# Patient Record
Sex: Female | Born: 1985 | Race: White | Hispanic: No | Marital: Married | Smoking: Never smoker
Health system: Southern US, Community
[De-identification: ages and names within clinical notes are randomized; demographics above are authoritative.]

## PROBLEM LIST (undated history)

## (undated) ENCOUNTER — Inpatient Hospital Stay (HOSPITAL_COMMUNITY): Payer: Self-pay

## (undated) DIAGNOSIS — Z8619 Personal history of other infectious and parasitic diseases: Secondary | ICD-10-CM

## (undated) DIAGNOSIS — N200 Calculus of kidney: Secondary | ICD-10-CM

## (undated) DIAGNOSIS — R519 Headache, unspecified: Secondary | ICD-10-CM

## (undated) HISTORY — PX: MYRINGOTOMY WITH TUBE PLACEMENT: SHX5663

## (undated) HISTORY — DX: Personal history of other infectious and parasitic diseases: Z86.19

## (undated) HISTORY — DX: Calculus of kidney: N20.0

## (undated) HISTORY — DX: Headache, unspecified: R51.9

## (undated) HISTORY — PX: TONSILLECTOMY: SUR1361

---

## 2005-07-13 ENCOUNTER — Ambulatory Visit: Payer: Self-pay | Admitting: Pediatrics

## 2013-07-30 ENCOUNTER — Other Ambulatory Visit (HOSPITAL_COMMUNITY): Payer: Self-pay | Admitting: Obstetrics and Gynecology

## 2013-07-30 DIAGNOSIS — Z3141 Encounter for fertility testing: Secondary | ICD-10-CM

## 2013-08-02 ENCOUNTER — Ambulatory Visit (HOSPITAL_COMMUNITY)
Admission: RE | Admit: 2013-08-02 | Discharge: 2013-08-02 | Disposition: A | Discharge status: Home / Self Care | Payer: BC Managed Care – PPO | Source: Ambulatory Visit | Attending: Obstetrics and Gynecology | Admitting: Obstetrics and Gynecology

## 2013-08-02 DIAGNOSIS — N979 Female infertility, unspecified: Secondary | ICD-10-CM | POA: Insufficient documentation

## 2013-08-02 DIAGNOSIS — Z3141 Encounter for fertility testing: Secondary | ICD-10-CM

## 2013-08-02 MED ORDER — IOHEXOL 300 MG/ML  SOLN
10.0000 mL | Freq: Once | INTRAMUSCULAR | Status: AC | PRN
  Administered 2013-08-02: 10 mL

## 2014-08-15 LAB — OB RESULTS CONSOLE GC/CHLAMYDIA
Chlamydia: NEGATIVE
GC PROBE AMP, GENITAL: NEGATIVE

## 2014-08-15 LAB — OB RESULTS CONSOLE RUBELLA ANTIBODY, IGM: RUBELLA: IMMUNE

## 2014-08-15 LAB — OB RESULTS CONSOLE HEPATITIS B SURFACE ANTIGEN: HEP B S AG: NEGATIVE

## 2014-08-15 LAB — OB RESULTS CONSOLE ANTIBODY SCREEN: Antibody Screen: NEGATIVE

## 2014-08-15 LAB — OB RESULTS CONSOLE HIV ANTIBODY (ROUTINE TESTING): HIV: NONREACTIVE

## 2014-08-15 LAB — OB RESULTS CONSOLE RPR: RPR: NONREACTIVE

## 2014-08-15 LAB — OB RESULTS CONSOLE ABO/RH: RH Type: NEGATIVE

## 2014-11-28 ENCOUNTER — Inpatient Hospital Stay (HOSPITAL_COMMUNITY)
Admission: AD | Admit: 2014-11-28 | Payer: BC Managed Care – PPO | Source: Ambulatory Visit | Admitting: Obstetrics and Gynecology

## 2015-02-20 LAB — OB RESULTS CONSOLE GBS: STREP GROUP B AG: POSITIVE

## 2015-03-10 ENCOUNTER — Encounter (HOSPITAL_COMMUNITY): Payer: Self-pay | Admitting: *Deleted

## 2015-03-10 ENCOUNTER — Telehealth (HOSPITAL_COMMUNITY): Payer: Self-pay | Admitting: *Deleted

## 2015-03-10 NOTE — Telephone Encounter (Signed)
Preadmission screen  

## 2015-03-18 ENCOUNTER — Inpatient Hospital Stay (HOSPITAL_COMMUNITY): Payer: BC Managed Care – PPO | Admitting: Anesthesiology

## 2015-03-18 ENCOUNTER — Inpatient Hospital Stay (HOSPITAL_COMMUNITY)
Admission: RE | Admit: 2015-03-18 | Discharge: 2015-03-20 | DRG: 775 | Disposition: A | Discharge status: Home / Self Care | Payer: BC Managed Care – PPO | Source: Ambulatory Visit | Attending: Obstetrics and Gynecology | Admitting: Obstetrics and Gynecology

## 2015-03-18 ENCOUNTER — Encounter (HOSPITAL_COMMUNITY): Payer: Self-pay

## 2015-03-18 DIAGNOSIS — R51 Headache: Secondary | ICD-10-CM | POA: Diagnosis present

## 2015-03-18 DIAGNOSIS — Z3A40 40 weeks gestation of pregnancy: Secondary | ICD-10-CM | POA: Diagnosis present

## 2015-03-18 DIAGNOSIS — O99354 Diseases of the nervous system complicating childbirth: Secondary | ICD-10-CM | POA: Diagnosis present

## 2015-03-18 DIAGNOSIS — Z809 Family history of malignant neoplasm, unspecified: Secondary | ICD-10-CM

## 2015-03-18 DIAGNOSIS — O480 Post-term pregnancy: Secondary | ICD-10-CM | POA: Diagnosis present

## 2015-03-18 LAB — CBC
HEMATOCRIT: 38.1 % (ref 36.0–46.0)
Hemoglobin: 13.2 g/dL (ref 12.0–15.0)
MCH: 30.9 pg (ref 26.0–34.0)
MCHC: 34.6 g/dL (ref 30.0–36.0)
MCV: 89.2 fL (ref 78.0–100.0)
Platelets: 153 10*3/uL (ref 150–400)
RBC: 4.27 MIL/uL (ref 3.87–5.11)
RDW: 14 % (ref 11.5–15.5)
WBC: 11.8 10*3/uL — ABNORMAL HIGH (ref 4.0–10.5)

## 2015-03-18 LAB — RPR: RPR: NONREACTIVE

## 2015-03-18 MED ORDER — LIDOCAINE HCL (PF) 1 % IJ SOLN
INTRAMUSCULAR | Status: DC | PRN
  Administered 2015-03-18 (×2): 4 mL

## 2015-03-18 MED ORDER — DIPHENHYDRAMINE HCL 50 MG/ML IJ SOLN: 12.5000 mg | INTRAMUSCULAR | Status: DC | PRN

## 2015-03-18 MED ORDER — OXYTOCIN 40 UNITS IN LACTATED RINGERS INFUSION - SIMPLE MED
1.0000 m[IU]/min | INTRAVENOUS | Status: DC
  Administered 2015-03-18: 1 m[IU]/min via INTRAVENOUS
  Filled 2015-03-18: qty 1000

## 2015-03-18 MED ORDER — OXYCODONE-ACETAMINOPHEN 5-325 MG PO TABS: 1.0000 | ORAL_TABLET | ORAL | Status: DC | PRN

## 2015-03-18 MED ORDER — ACETAMINOPHEN 325 MG PO TABS: 650.0000 mg | ORAL_TABLET | ORAL | Status: DC | PRN

## 2015-03-18 MED ORDER — TETANUS-DIPHTH-ACELL PERTUSSIS 5-2.5-18.5 LF-MCG/0.5 IM SUSP: 0.5000 mL | Freq: Once | INTRAMUSCULAR | Status: DC

## 2015-03-18 MED ORDER — ONDANSETRON HCL 4 MG/2ML IJ SOLN: 4.0000 mg | Freq: Four times a day (QID) | INTRAMUSCULAR | Status: DC | PRN

## 2015-03-18 MED ORDER — DIPHENHYDRAMINE HCL 25 MG PO CAPS: 25.0000 mg | ORAL_CAPSULE | Freq: Four times a day (QID) | ORAL | Status: DC | PRN

## 2015-03-18 MED ORDER — OXYCODONE-ACETAMINOPHEN 5-325 MG PO TABS: 2.0000 | ORAL_TABLET | ORAL | Status: DC | PRN

## 2015-03-18 MED ORDER — PRENATAL MULTIVITAMIN CH
1.0000 | ORAL_TABLET | Freq: Every day | ORAL | Status: DC
  Administered 2015-03-19 – 2015-03-20 (×2): 1 via ORAL
  Filled 2015-03-18 (×2): qty 1

## 2015-03-18 MED ORDER — LACTATED RINGERS IV SOLN
500.0000 mL | INTRAVENOUS | Status: DC | PRN
  Administered 2015-03-18: 500 mL via INTRAVENOUS

## 2015-03-18 MED ORDER — PHENYLEPHRINE 40 MCG/ML (10ML) SYRINGE FOR IV PUSH (FOR BLOOD PRESSURE SUPPORT)
80.0000 ug | PREFILLED_SYRINGE | INTRAVENOUS | Status: DC | PRN
Start: 2015-03-18 — End: 2015-03-18
  Filled 2015-03-18: qty 2
  Filled 2015-03-18: qty 20

## 2015-03-18 MED ORDER — OXYTOCIN 40 UNITS IN LACTATED RINGERS INFUSION - SIMPLE MED: 1.0000 m[IU]/min | INTRAVENOUS | Status: DC

## 2015-03-18 MED ORDER — BENZOCAINE-MENTHOL 20-0.5 % EX AERO
1.0000 "application " | INHALATION_SPRAY | CUTANEOUS | Status: DC | PRN
  Administered 2015-03-20: 1 via TOPICAL
  Filled 2015-03-18: qty 56

## 2015-03-18 MED ORDER — EPHEDRINE 5 MG/ML INJ: 10.0000 mg | INTRAVENOUS | Status: DC | PRN

## 2015-03-18 MED ORDER — MEASLES, MUMPS & RUBELLA VAC ~~LOC~~ INJ: 0.5000 mL | INJECTION | Freq: Once | SUBCUTANEOUS | Status: DC

## 2015-03-18 MED ORDER — SENNOSIDES-DOCUSATE SODIUM 8.6-50 MG PO TABS
2.0000 | ORAL_TABLET | ORAL | Status: DC
  Administered 2015-03-19 – 2015-03-20 (×2): 2 via ORAL
  Filled 2015-03-18 (×2): qty 2

## 2015-03-18 MED ORDER — OXYTOCIN 40 UNITS IN LACTATED RINGERS INFUSION - SIMPLE MED: 62.5000 mL/h | INTRAVENOUS | Status: DC

## 2015-03-18 MED ORDER — ZOLPIDEM TARTRATE 5 MG PO TABS
5.0000 mg | ORAL_TABLET | Freq: Every evening | ORAL | Status: DC | PRN
  Administered 2015-03-18: 5 mg via ORAL
  Filled 2015-03-18: qty 1

## 2015-03-18 MED ORDER — DIBUCAINE 1 % RE OINT: 1.0000 "application " | TOPICAL_OINTMENT | RECTAL | Status: DC | PRN

## 2015-03-18 MED ORDER — LACTATED RINGERS IV SOLN
INTRAVENOUS | Status: DC
  Administered 2015-03-18 (×3): via INTRAVENOUS

## 2015-03-18 MED ORDER — PENICILLIN G POTASSIUM 5000000 UNITS IJ SOLR
2.5000 10*6.[IU] | INTRAVENOUS | Status: DC
  Administered 2015-03-18 (×2): 2.5 10*6.[IU] via INTRAVENOUS
  Filled 2015-03-18 (×7): qty 2.5

## 2015-03-18 MED ORDER — FENTANYL 2.5 MCG/ML BUPIVACAINE 1/10 % EPIDURAL INFUSION (WH - ANES)
14.0000 mL/h | INTRAMUSCULAR | Status: DC | PRN
  Administered 2015-03-18 (×3): 14 mL/h via EPIDURAL
  Filled 2015-03-18 (×2): qty 125

## 2015-03-18 MED ORDER — MEDROXYPROGESTERONE ACETATE 150 MG/ML IM SUSP: 150.0000 mg | INTRAMUSCULAR | Status: DC | PRN

## 2015-03-18 MED ORDER — LANOLIN HYDROUS EX OINT: TOPICAL_OINTMENT | CUTANEOUS | Status: DC | PRN

## 2015-03-18 MED ORDER — SIMETHICONE 80 MG PO CHEW: 80.0000 mg | CHEWABLE_TABLET | ORAL | Status: DC | PRN

## 2015-03-18 MED ORDER — OXYTOCIN BOLUS FROM INFUSION: 500.0000 mL | INTRAVENOUS | Status: DC

## 2015-03-18 MED ORDER — CITRIC ACID-SODIUM CITRATE 334-500 MG/5ML PO SOLN: 30.0000 mL | ORAL | Status: DC | PRN

## 2015-03-18 MED ORDER — ONDANSETRON HCL 4 MG/2ML IJ SOLN: 4.0000 mg | INTRAMUSCULAR | Status: DC | PRN

## 2015-03-18 MED ORDER — WITCH HAZEL-GLYCERIN EX PADS: 1.0000 "application " | MEDICATED_PAD | CUTANEOUS | Status: DC | PRN

## 2015-03-18 MED ORDER — LIDOCAINE HCL (PF) 1 % IJ SOLN: 30.0000 mL | INTRAMUSCULAR | Status: DC | PRN

## 2015-03-18 MED ORDER — DEXTROSE 5 % IV SOLN
5.0000 10*6.[IU] | Freq: Once | INTRAVENOUS | Status: AC
  Administered 2015-03-18: 5 10*6.[IU] via INTRAVENOUS
  Filled 2015-03-18: qty 5

## 2015-03-18 MED ORDER — TERBUTALINE SULFATE 1 MG/ML IJ SOLN: 0.2500 mg | Freq: Once | INTRAMUSCULAR | Status: DC | PRN

## 2015-03-18 MED ORDER — ONDANSETRON HCL 4 MG PO TABS: 4.0000 mg | ORAL_TABLET | ORAL | Status: DC | PRN

## 2015-03-18 MED ORDER — IBUPROFEN 600 MG PO TABS
600.0000 mg | ORAL_TABLET | Freq: Four times a day (QID) | ORAL | Status: DC
  Administered 2015-03-19 – 2015-03-20 (×7): 600 mg via ORAL
  Filled 2015-03-18 (×7): qty 1

## 2015-03-18 NOTE — Progress Notes (Signed)
SVD of vigorous female infant w/ apgars of 8,9.  Placenta delivered spontaneous w/ 3VC.   2nd degree lac repaired w/ 3-0 vicryl rapide.  Fundus firm.  EBL 400cc .

## 2015-03-18 NOTE — Anesthesia Preprocedure Evaluation (Signed)
Anesthesia Evaluation  Patient identified by MRN, date of birth, ID band Patient awake    Reviewed: Allergy & Precautions, NPO status , Patient's Chart, lab work & pertinent test results  History of Anesthesia Complications Negative for: history of anesthetic complications  Airway Mallampati: II  TM Distance: >3 FB Neck ROM: Full    Dental no notable dental hx. (+) Dental Advisory Given   Pulmonary neg pulmonary ROS,  breath sounds clear to auscultation  Pulmonary exam normal       Cardiovascular negative cardio ROS Normal cardiovascular examRhythm:Regular Rate:Normal     Neuro/Psych  Headaches, negative psych ROS   GI/Hepatic negative GI ROS, Neg liver ROS,   Endo/Other  negative endocrine ROS  Renal/GU negative Renal ROS  negative genitourinary   Musculoskeletal negative musculoskeletal ROS (+)   Abdominal   Peds negative pediatric ROS (+)  Hematology negative hematology ROS (+)   Anesthesia Other Findings   Reproductive/Obstetrics (+) Pregnancy                             Anesthesia Physical Anesthesia Plan  ASA: II  Anesthesia Plan: Epidural   Post-op Pain Management:    Induction:   Airway Management Planned:   Additional Equipment:   Intra-op Plan:   Post-operative Plan:   Informed Consent: I have reviewed the patients History and Physical, chart, labs and discussed the procedure including the risks, benefits and alternatives for the proposed anesthesia with the patient or authorized representative who has indicated his/her understanding and acceptance.   Dental advisory given  Plan Discussed with: CRNA  Anesthesia Plan Comments:         Anesthesia Quick Evaluation

## 2015-03-18 NOTE — Progress Notes (Signed)
Patient tolerated epidural placement well.

## 2015-03-18 NOTE — Plan of Care (Signed)
Problem: Consults Goal: Birthing Suites Patient Information Press F2 to bring up selections list  Outcome: Completed/Met Date Met:  03/18/15  Pt > [redacted] weeks EGA and Inpatient induction

## 2015-03-18 NOTE — Anesthesia Procedure Notes (Signed)
Epidural Patient location during procedure: OB  Staffing Anesthesiologist: Anasophia Pecor Performed by: anesthesiologist   Preanesthetic Checklist Completed: patient identified, site marked, surgical consent, pre-op evaluation, timeout performed, IV checked, risks and benefits discussed and monitors and equipment checked  Epidural Patient position: sitting Prep: site prepped and draped and DuraPrep Patient monitoring: continuous pulse ox and blood pressure Approach: midline Location: L3-L4 Injection technique: LOR saline  Needle:  Needle type: Tuohy  Needle gauge: 17 G Needle length: 9 cm and 9 Needle insertion depth: 5 cm cm Catheter type: closed end flexible Catheter size: 19 Gauge Catheter at skin depth: 10 cm Test dose: negative  Assessment Events: blood not aspirated, injection not painful, no injection resistance, negative IV test and no paresthesia  Additional Notes Patient identified. Risks/Benefits/Options discussed with patient including but not limited to bleeding, infection, nerve damage, paralysis, failed block, incomplete pain control, headache, blood pressure changes, nausea, vomiting, reactions to medication both or allergic, itching and postpartum back pain. Confirmed with bedside nurse the patient's most recent platelet count. Confirmed with patient that they are not currently taking any anticoagulation, have any bleeding history or any family history of bleeding disorders. Patient expressed understanding and wished to proceed. All questions were answered. Sterile technique was used throughout the entire procedure. Please see nursing notes for vital signs. Test dose was given through epidural catheter and negative prior to continuing to dose epidural or start infusion. Warning signs of high block given to the patient including shortness of breath, tingling/numbness in hands, complete motor block, or any concerning symptoms with instructions to call for help. Patient was  given instructions on fall risk and not to get out of bed. All questions and concerns addressed with instructions to call with any issues or inadequate analgesia.      

## 2015-03-18 NOTE — H&P (Signed)
Rachael Elliott is a 29 y.o. female G1 @ 40+ wks presenting for postdates IOL.  History OB History    Gravida Para Term Preterm AB TAB SAB Ectopic Multiple Living   1              Past Medical History  Diagnosis Date  . Hx of varicella   . Headache    Past Surgical History  Procedure Laterality Date  . Tonsillectomy     Family History: family history includes Cancer in her mother. Social History:  reports that she has never smoked. She has never used smokeless tobacco. She reports that she does not drink alcohol or use illicit drugs.   Prenatal Transfer Tool  Maternal Diabetes: No Genetic Screening: declines Maternal Ultrasounds/Referrals: Normal Fetal Ultrasounds or other Referrals:  None Maternal Substance Abuse:  No Significant Maternal Medications:  None Significant Maternal Lab Results:  None Other Comments:  None  ROS  Dilation: 2 Effacement (%): 80 Station: -2 Exam by:: Dr Renaldo Fiddler Blood pressure 129/89, pulse 74, temperature 98.2 F (36.8 C), temperature source Oral, resp. rate 16, height  (1.727 m), weight 185 lb (83.915 kg). Exam Physical Exam  Gen - NAD Abd - gravid, NT  EFW 8# Ext - NT, bilat edema Cvx 2/80/-2 AROM - clear Prenatal labs: ABO, Rh: --/--/A NEG (08/23 0100) Antibody: POS (08/23 0100) Rubella: Immune (01/21 0000) RPR: Nonreactive (01/21 0000)  HBsAg: Negative (01/21 0000)  HIV: Non-reactive (01/21 0000)  GBS: Positive (07/28 0000)   Assessment/Plan: Admit Pitocin IOL PCN for GBS Epidural prn   Rachael Elliott 03/18/2015, 7:57 AM

## 2015-03-19 LAB — CBC
HCT: 35.5 % — ABNORMAL LOW (ref 36.0–46.0)
Hemoglobin: 12.1 g/dL (ref 12.0–15.0)
MCH: 30.6 pg (ref 26.0–34.0)
MCHC: 34.1 g/dL (ref 30.0–36.0)
MCV: 89.6 fL (ref 78.0–100.0)
PLATELETS: 130 10*3/uL — AB (ref 150–400)
RBC: 3.96 MIL/uL (ref 3.87–5.11)
RDW: 13.9 % (ref 11.5–15.5)
WBC: 16.2 10*3/uL — ABNORMAL HIGH (ref 4.0–10.5)

## 2015-03-19 NOTE — Anesthesia Postprocedure Evaluation (Signed)
  Anesthesia Post-op Note  Patient: Rachael Elliott  Procedure(s) Performed: * No procedures listed *  Patient Location: Mother/Baby  Anesthesia Type:Epidural  Level of Consciousness: awake, alert , oriented and patient cooperative  Airway and Oxygen Therapy: Patient Spontanous Breathing  Post-op Pain: mild  Post-op Assessment: Patient's Cardiovascular Status Stable, Respiratory Function Stable, No headache, No backache and Patient able to bend at knees              Post-op Vital Signs: Reviewed and stable  Last Vitals:  Filed Vitals:   03/19/15 0524  BP: 113/68  Pulse: 75  Temp: 36.4 C  Resp: 18    Complications: No apparent anesthesia complications

## 2015-03-19 NOTE — Progress Notes (Signed)
Post Partum Day 1 Subjective: no complaints, up ad lib, voiding and tolerating PO  Objective: Blood pressure 113/68, pulse 75, temperature 97.6 F (36.4 C), temperature source Oral, resp. rate 18, height  (1.727 m), weight 185 lb (83.915 kg), SpO2 89 %, unknown if currently breastfeeding.  Physical Exam:  General: alert and cooperative Lochia: appropriate Uterine Fundus: firm Incision: healing well, small labial edema noted DVT Evaluation: No evidence of DVT seen on physical exam. Negative Homan's sign. No cords or calf tenderness. No significant calf/ankle edema.   Recent Labs  03/18/15 0100 03/19/15 0520  HGB 13.2 12.1  HCT 38.1 35.5*    Assessment/Plan: Plan for discharge tomorrow   LOS: 1 day   Rachael Elliott 03/19/2015, 7:58 AM

## 2015-03-20 ENCOUNTER — Ambulatory Visit: Payer: Self-pay

## 2015-03-20 MED ORDER — OXYCODONE-ACETAMINOPHEN 5-325 MG PO TABS: 1.0000 | ORAL_TABLET | ORAL | Status: DC | PRN

## 2015-03-20 MED ORDER — IBUPROFEN 600 MG PO TABS: 600.0000 mg | ORAL_TABLET | Freq: Four times a day (QID) | ORAL | Status: DC

## 2015-03-20 NOTE — Discharge Summary (Signed)
Obstetric Discharge Summary Reason for Admission: induction of labor Prenatal Procedures: ultrasound Intrapartum Procedures: spontaneous vaginal delivery Postpartum Procedures: none Complications-Operative and Postpartum: 2 degree perineal laceration HEMOGLOBIN  Date Value Ref Range Status  03/19/2015 12.1 12.0 - 15.0 g/dL Final   HCT  Date Value Ref Range Status  03/19/2015 35.5* 36.0 - 46.0 % Final    Physical Exam:  General: alert and cooperative, denies hA Lochia: appropriate Uterine Fundus: firm Incision: healing well DVT Evaluation: No evidence of DVT seen on physical exam. Negative Homan's sign. No cords or calf tenderness. No significant calf/ankle edema. BP RECHECKED 110/77 Discharge Diagnoses: Term Pregnancy-delivered  Discharge Information: Date: 03/20/2015 Activity: pelvic rest Diet: routine Medications: PNV, Ibuprofen and Percocet Condition: stable Instructions: refer to practice specific booklet Discharge to: home   Newborn Data: Live born female  Birth Weight: 8 lb 6.4 oz (3810 g) APGAR: 8, 9  Home with mother.  CURTIS,CAROL G 03/20/2015, 8:33 AM

## 2015-03-20 NOTE — Lactation Note (Addendum)
This note was copied from the chart of Rachael Ariannie Penaloza. Lactation Consultation Note: Father of infant came in the hallway and ask if I could come back to see infant feeding. Parents informed me that infant had a choking episode and infant spit a lot of yellow spit up. Mother states that infant turned blue when he was choking. Parents afraid that infant would choke again. I advised parents in using bulb syringe and rolling infant to side when choking. I informed infants staff nurse Morrie Sheldon. Parents given reassurance and support.  I observed infant at the breast with audible swallows. Infant only needed  slight adjustment in  positioning. Lots more teaching with parents. Episode of choking caused them to have lots of anxiety. They plan to stay for another feeding.   Patient Name: Rachael Elliott [id number] Date: 03/20/2015 Reason for consult: Follow-up assessment   Maternal Data    Feeding Feeding Type: Breast Fed Length of feed: 15 min  LATCH Score/Interventions Latch: Grasps breast easily, tongue down, lips flanged, rhythmical sucking. Intervention(s): Adjust position  Audible Swallowing: Spontaneous and intermittent  Type of Nipple: Everted at rest and after stimulation  Comfort (Breast/Nipple): Filling, red/small blisters or bruises, mild/mod discomfort     Hold (Positioning): Assistance needed to correctly position infant at breast and maintain latch. Intervention(s): Support Pillows;Position options  LATCH Score: 8  Lactation Tools Discussed/Used     Consult Status Consult Status: Complete    Michel Bickers 03/20/2015, 2:27 PM

## 2015-03-20 NOTE — Lactation Note (Signed)
This note was copied from the chart of Rachael Ariyona Eid. Lactation Consultation Note; Follow up for [name] assistance. . Parents are ready to go home. Inquired if any questions. Parents had lots of questions. Reviewed entire baby and me book, proper latch, wet and dirty chart, milk storage guidelines and treatment for engorgement. Mother advised to follow up with LC or BFSG's. Parents receptive to all teaching.   Patient Name: Rachael Elliott [id number] Date: 03/20/2015 Reason for consult: Follow-up assessment   Maternal Data    Feeding Feeding Type: Breast Fed Length of feed: 15 min  LATCH Score/Interventions Latch: Grasps breast easily, tongue down, lips flanged, rhythmical sucking. Intervention(s): Adjust position  Audible Swallowing: Spontaneous and intermittent  Type of Nipple: Everted at rest and after stimulation  Comfort (Breast/Nipple): Filling, red/small blisters or bruises, mild/mod discomfort     Hold (Positioning): Assistance needed to correctly position infant at breast and maintain latch. Intervention(s): Support Pillows;Position options  LATCH Score: 8  Lactation Tools Discussed/Used     Consult Status Consult Status: Complete    Michel Bickers 03/20/2015, 2:20 PM

## 2015-03-21 LAB — TYPE AND SCREEN
ABO/RH(D): A NEG
ANTIBODY SCREEN: POSITIVE
DAT, IgG: NEGATIVE
UNIT DIVISION: 0
Unit division: 0

## 2017-06-14 LAB — OB RESULTS CONSOLE GC/CHLAMYDIA
Chlamydia: NEGATIVE
Gonorrhea: NEGATIVE

## 2017-06-14 LAB — OB RESULTS CONSOLE RPR: RPR: NONREACTIVE

## 2017-06-14 LAB — OB RESULTS CONSOLE RUBELLA ANTIBODY, IGM: RUBELLA: IMMUNE

## 2017-06-14 LAB — OB RESULTS CONSOLE ANTIBODY SCREEN: Antibody Screen: NEGATIVE

## 2017-06-14 LAB — OB RESULTS CONSOLE ABO/RH: RH TYPE: NEGATIVE

## 2017-06-14 LAB — OB RESULTS CONSOLE HEPATITIS B SURFACE ANTIGEN: HEP B S AG: NEGATIVE

## 2017-06-14 LAB — OB RESULTS CONSOLE HIV ANTIBODY (ROUTINE TESTING): HIV: NONREACTIVE

## 2017-11-16 ENCOUNTER — Other Ambulatory Visit: Payer: Self-pay

## 2017-11-16 ENCOUNTER — Encounter (HOSPITAL_COMMUNITY): Payer: Self-pay

## 2017-11-16 ENCOUNTER — Inpatient Hospital Stay (HOSPITAL_COMMUNITY): Payer: BC Managed Care – PPO

## 2017-11-16 ENCOUNTER — Observation Stay (HOSPITAL_COMMUNITY)
Admission: AD | Admit: 2017-11-16 | Discharge: 2017-11-17 | Disposition: A | Discharge status: Home / Self Care | Payer: BC Managed Care – PPO | Source: Ambulatory Visit | Attending: Obstetrics and Gynecology | Admitting: Obstetrics and Gynecology

## 2017-11-16 DIAGNOSIS — O26833 Pregnancy related renal disease, third trimester: Secondary | ICD-10-CM | POA: Diagnosis not present

## 2017-11-16 DIAGNOSIS — R1030 Lower abdominal pain, unspecified: Secondary | ICD-10-CM | POA: Diagnosis not present

## 2017-11-16 DIAGNOSIS — O9989 Other specified diseases and conditions complicating pregnancy, childbirth and the puerperium: Secondary | ICD-10-CM

## 2017-11-16 DIAGNOSIS — Z79899 Other long term (current) drug therapy: Secondary | ICD-10-CM | POA: Insufficient documentation

## 2017-11-16 DIAGNOSIS — N200 Calculus of kidney: Secondary | ICD-10-CM | POA: Insufficient documentation

## 2017-11-16 DIAGNOSIS — M545 Low back pain: Secondary | ICD-10-CM | POA: Diagnosis present

## 2017-11-16 DIAGNOSIS — M549 Dorsalgia, unspecified: Secondary | ICD-10-CM | POA: Diagnosis not present

## 2017-11-16 DIAGNOSIS — Z3A30 30 weeks gestation of pregnancy: Secondary | ICD-10-CM | POA: Diagnosis not present

## 2017-11-16 DIAGNOSIS — O26839 Pregnancy related renal disease, unspecified trimester: Secondary | ICD-10-CM

## 2017-11-16 DIAGNOSIS — O99891 Other specified diseases and conditions complicating pregnancy: Secondary | ICD-10-CM

## 2017-11-16 DIAGNOSIS — R110 Nausea: Secondary | ICD-10-CM | POA: Diagnosis not present

## 2017-11-16 LAB — URINALYSIS, ROUTINE W REFLEX MICROSCOPIC
Bilirubin Urine: NEGATIVE
Glucose, UA: NEGATIVE mg/dL
HGB URINE DIPSTICK: NEGATIVE
Ketones, ur: NEGATIVE mg/dL
NITRITE: NEGATIVE
PROTEIN: NEGATIVE mg/dL
Specific Gravity, Urine: 1.011 (ref 1.005–1.030)
pH: 7 (ref 5.0–8.0)

## 2017-11-16 MED ORDER — LACTATED RINGERS IV SOLN
INTRAVENOUS | Status: DC
  Administered 2017-11-16 – 2017-11-17 (×2): via INTRAVENOUS

## 2017-11-16 MED ORDER — ZOLPIDEM TARTRATE 5 MG PO TABS: 5.0000 mg | ORAL_TABLET | Freq: Every evening | ORAL | Status: DC | PRN

## 2017-11-16 MED ORDER — PRENATAL MULTIVITAMIN CH: 1.0000 | ORAL_TABLET | Freq: Every day | ORAL | Status: DC

## 2017-11-16 MED ORDER — DOCUSATE SODIUM 100 MG PO CAPS: 100.0000 mg | ORAL_CAPSULE | Freq: Every day | ORAL | Status: DC

## 2017-11-16 MED ORDER — CYCLOBENZAPRINE HCL 10 MG PO TABS
10.0000 mg | ORAL_TABLET | Freq: Once | ORAL | Status: AC
  Administered 2017-11-16: 10 mg via ORAL
  Filled 2017-11-16: qty 1

## 2017-11-16 MED ORDER — ACETAMINOPHEN 325 MG PO TABS: 650.0000 mg | ORAL_TABLET | ORAL | Status: DC | PRN

## 2017-11-16 MED ORDER — ONDANSETRON 8 MG PO TBDP
8.0000 mg | ORAL_TABLET | Freq: Once | ORAL | Status: AC
  Administered 2017-11-16: 8 mg via ORAL
  Filled 2017-11-16: qty 1

## 2017-11-16 MED ORDER — CALCIUM CARBONATE ANTACID 500 MG PO CHEW: 2.0000 | CHEWABLE_TABLET | ORAL | Status: DC | PRN

## 2017-11-16 NOTE — MAU Provider Note (Signed)
History     CSN: [id number]  Arrival date and time: 11/16/17 1751   First Provider Initiated Contact with Patient 11/16/17 1957      Chief Complaint  Patient presents with  . Back Pain  . Nausea   HPI  Ms.  Rachael Elliott is a 32 y.o. year old [id number] female at 4157w6d weeks gestation who presents to MAU reporting sudden onset of low back pain that radiates from lower abdomen today, nausea & hot flashes from the pain. She reports good (+) FM.  Past Medical History:  Diagnosis Date  . Hx of varicella     Past Surgical History:  Procedure Laterality Date  . TONSILLECTOMY      Family History  Problem Relation Age of Onset  . Cancer Mother        breast  . Cancer Maternal Grandmother     Social History   Tobacco Use  . Smoking status: Never Smoker  . Smokeless tobacco: Never Used  Substance Use Topics  . Alcohol use: No  . Drug use: No    Allergies:  Allergies  Allergen Reactions  . Sulfa Antibiotics Rash    Medications Prior to Admission  Medication Sig Dispense Refill Last Dose  . calcium carbonate (TUMS - DOSED IN MG ELEMENTAL CALCIUM) 500 MG chewable tablet Chew 1 tablet by mouth 2 (two) times daily as needed for indigestion or heartburn.   prn  . Prenatal Vit-Fe Fumarate-FA (PRENATAL MULTIVITAMIN) TABS tablet Take 1 tablet by mouth daily at 12 noon.   11/15/2017 at Unknown time    Review of Systems  Constitutional: Negative.   HENT: Negative.   Eyes: Negative.   Respiratory: Negative.   Cardiovascular: Negative.   Gastrointestinal: Positive for abdominal pain (lower).  Endocrine: Negative.   Genitourinary: Negative.   Musculoskeletal: Positive for back pain (lower).  Skin: Negative.   Allergic/Immunologic: Negative.   Neurological: Negative.   Hematological: Negative.   Psychiatric/Behavioral: Negative.    Physical Exam   Blood pressure 112/74, pulse 81, temperature (!) 97.4 F (36.3 C), temperature source Oral, resp. rate 18, height 5\' 8"   (1.727 m), weight 166 lb 9.6 oz (75.6 kg).  Physical Exam  Nursing note and vitals reviewed. Constitutional: She appears well-developed and well-nourished.  HENT:  Head: Normocephalic and atraumatic.  Eyes: Pupils are equal, round, and reactive to light.  Neck: Normal range of motion.  Cardiovascular: Normal rate, regular rhythm and normal heart sounds.  Respiratory: Effort normal and breath sounds normal.  GI: Soft. Bowel sounds are normal. There is no CVA tenderness.  Genitourinary:  Genitourinary Comments:    Musculoskeletal: Normal range of motion.  Neurological: She is alert.  Skin: Skin is warm and dry.  Psychiatric: She has a normal mood and affect. Her behavior is normal. Judgment and thought content normal.    MAU Course  Procedures  MDM CCUA NST - FHR: 130 bpm / moderate variability / accels present / decels absent / TOCO: none Renal U/S Flexeril 10 mg -- minimal relief, "but still can't get comfortable"  *Consult with Dr. Henderson Cloudomblin @ 2005 - notified of patient's complaints, assessments, lab & U/S results, recommended tx plan renal U/S, Flexeril 10 mg *TC to Dr .Henderson Cloudomblin @ 2205 - ok to d/c home with pain meds and strainer, if pain well-controlled now or admit to HROB, if pain not controlled  Results for orders placed or performed during the hospital encounter of 11/16/17 (from the past 24 hour(s))  Urinalysis, Routine w reflex microscopic  Status: Abnormal   Collection Time: 11/16/17  5:51 PM  Result Value Ref Range   Color, Urine YELLOW YELLOW   APPearance CLEAR CLEAR   Specific Gravity, Urine 1.011 1.005 - 1.030   pH 7.0 5.0 - 8.0   Glucose, UA NEGATIVE NEGATIVE mg/dL   Hgb urine dipstick NEGATIVE NEGATIVE   Bilirubin Urine NEGATIVE NEGATIVE   Ketones, ur NEGATIVE NEGATIVE mg/dL   Protein, ur NEGATIVE NEGATIVE mg/dL   Nitrite NEGATIVE NEGATIVE   Leukocytes, UA SMALL (A) NEGATIVE   RBC / HPF 0-5 0 - 5 RBC/hpf   WBC, UA 6-10 0 - 5 WBC/hpf   Bacteria, UA RARE  (A) NONE SEEN   Squamous Epithelial / LPF 6-10 0 - 5   Mucus PRESENT     US Renal  Result Date: 11/16/2017 CLINICAL DATA:  Back pain, 30.[redacted] weeks pregnant EXAM: RENAL / URINARY TRACT ULTRASOUND COMPLETE COMPARISON:  None. FINDINGS: Right Kidney: Length: 10.8 cm. Echogenicity within normal limits. No mass or hydronephrosis visualized. Left Kidney: Length: 13.7 cm. Echogenicity within normal limits. No mass. 9 mm shadowing stone in the mid pole. Mild hydronephrosis in the upper pole; questionable duplex configuration. Bladder: Appears empty. IMPRESSION: 1. 9 mm stone in the midpole of the left kidney. There is mild hydronephrosis of the upper pole of left kidney. Asymmetric left larger than right kidney with hydronephrosis involving only the upper pole raises possibility of duplex configuration of left kidney. Electronically Signed   By: Jasmine Pang M.D.   On: 11/16/2017 21:37    Assessment and Plan  Calculus of kidney affecting pregnancy in third trimester Admit to HROB uit - Flomax (not ordered -- pt allergy to sulfa drugs) - Strain all urines - Dilaudid 2 mg IV every 2 hrs prn pain - LR bolus - NST - Dr. Henderson Cloud assumes care of patient upon admission  Rachael Mora, MSN, CNM 11/16/2017, 7:57 PM

## 2017-11-16 NOTE — MAU Note (Signed)
Urine in the lab  

## 2017-11-16 NOTE — H&P (Signed)
Ms.  Rachael Elliott is a 32 y.o. year old [id number] female at 3433w6d weeks gestation who presents to MAU reporting sudden onset of low back pain that radiates from lower abdomen today, nausea & hot flashes from the pain. She reports good (+) FM.      Past Medical History:  Diagnosis Date  . Hx of varicella          Past Surgical History:  Procedure Laterality Date  . TONSILLECTOMY           Family History  Problem Relation Age of Onset  . Cancer Mother        breast  . Cancer Maternal Grandmother     Social History       Tobacco Use  . Smoking status: Never Smoker  . Smokeless tobacco: Never Used  Substance Use Topics  . Alcohol use: No  . Drug use: No    Allergies:      Allergies  Allergen Reactions  . Sulfa Antibiotics Rash           Medications Prior to Admission  Medication Sig Dispense Refill Last Dose  . calcium carbonate (TUMS - DOSED IN MG ELEMENTAL CALCIUM) 500 MG chewable tablet Chew 1 tablet by mouth 2 (two) times daily as needed for indigestion or heartburn.   prn  . Prenatal Vit-Fe Fumarate-FA (PRENATAL MULTIVITAMIN) TABS tablet Take 1 tablet by mouth daily at 12 noon.   11/15/2017 at Unknown time    Review of Systems  Constitutional: Negative.   HENT: Negative.   Eyes: Negative.   Respiratory: Negative.   Cardiovascular: Negative.   Gastrointestinal: Positive for abdominal pain (lower).  Endocrine: Negative.   Genitourinary: Negative.   Musculoskeletal: Positive for back pain (lower).  Skin: Negative.   Allergic/Immunologic: Negative.   Neurological: Negative.   Hematological: Negative.   Psychiatric/Behavioral: Negative.    Physical Exam   Blood pressure 112/74, pulse 81, temperature (!) 97.4 F (36.3 C), temperature source Oral, resp. rate 18, height 5\' 8"  (1.727 m), weight 166 lb 9.6 oz (75.6 kg).  Physical Exam  Nursing note and vitals reviewed. Constitutional: She appears well-developed and  well-nourished.  HENT:  Head: Normocephalic and atraumatic.  Eyes: Pupils are equal, round, and reactive to light.  Neck: Normal range of motion.  Cardiovascular: Normal rate, regular rhythm and normal heart sounds.  Respiratory: Effort normal and breath sounds normal.  GI: Soft. Bowel sounds are normal. There is no CVA tenderness.  Genitourinary:  Genitourinary Comments:    Musculoskeletal: Normal range of motion.  Neurological: She is alert.  Skin: Skin is warm and dry.  Psychiatric: She has a normal mood and affect. Her behavior is normal. Judgment and thought content normal.    MAU Course  Procedures  MDM CCUA NST - FHR: 130 bpm / moderate variability / accels present / decels absent / TOCO: none Renal U/S Flexeril 10 mg -- minimal relief, "but still can't get comfortable"  *Consult with Dr. Henderson Cloudomblin @ 2005 - notified of patient's complaints, assessments, lab & U/S results, recommended tx plan renal U/S, Flexeril 10 mg *TC to Dr .Henderson Cloudomblin @ 2205 - ok to d/c home with pain meds and strainer, if pain well-controlled now or admit to HROB, if pain not controlled  LabResultsLast24Hours       Results for orders placed or performed during the hospital encounter of 11/16/17 (from the past 24 hour(s))  Urinalysis, Routine w reflex microscopic     Status: Abnormal   Collection Time:  11/16/17  5:51 PM  Result Value Ref Range   Color, Urine YELLOW YELLOW   APPearance CLEAR CLEAR   Specific Gravity, Urine 1.011 1.005 - 1.030   pH 7.0 5.0 - 8.0   Glucose, UA NEGATIVE NEGATIVE mg/dL   Hgb urine dipstick NEGATIVE NEGATIVE   Bilirubin Urine NEGATIVE NEGATIVE   Ketones, ur NEGATIVE NEGATIVE mg/dL   Protein, ur NEGATIVE NEGATIVE mg/dL   Nitrite NEGATIVE NEGATIVE   Leukocytes, UA SMALL (A) NEGATIVE   RBC / HPF 0-5 0 - 5 RBC/hpf   WBC, UA 6-10 0 - 5 WBC/hpf   Bacteria, UA RARE (A) NONE SEEN   Squamous Epithelial / LPF 6-10 0 - 5   Mucus PRESENT       US  Renal  Result Date: 11/16/2017 CLINICAL DATA:  Back pain, 30.[redacted] weeks pregnant EXAM: RENAL / URINARY TRACT ULTRASOUND COMPLETE COMPARISON:  None. FINDINGS: Right Kidney: Length: 10.8 cm. Echogenicity within normal limits. No mass or hydronephrosis visualized. Left Kidney: Length: 13.7 cm. Echogenicity within normal limits. No mass. 9 mm shadowing stone in the mid pole. Mild hydronephrosis in the upper pole; questionable duplex configuration. Bladder: Appears empty. IMPRESSION: 1. 9 mm stone in the midpole of the left kidney. There is mild hydronephrosis of the upper pole of left kidney. Asymmetric left larger than right kidney with hydronephrosis involving only the upper pole raises possibility of duplex configuration of left kidney. Electronically Signed   By: Jasmine Pang M.D.   On: 11/16/2017 21:37    Assessment and Plan  Calculus of kidney affecting pregnancy in third trimester Admit to HROB uit - Flomax (not ordered -- pt allergy to sulfa drugs) - Strain all urines - Dilaudid 2 mg IV every 2 hrs prn pain - LR bolus - NST  Patient seen and examined Agree with above A/P: Left 9mm kidney stone in midpole with mild hydronephrosis in upper pole of left kidney>possible duplex collecting system D/W patient and husband plan of IV fluids, strain urine, pain medicine prn She states she understands and agrees

## 2017-11-16 NOTE — Progress Notes (Signed)
G2P1  @ 30.[redacted] wksga. Here dt back pain and nausea. Denies LOF or bleeding. +FM. EFM applied.

## 2017-11-17 MED ORDER — HYDROMORPHONE HCL 1 MG/ML IJ SOLN
1.0000 mg | INTRAMUSCULAR | Status: DC | PRN
  Administered 2017-11-17: 1 mg via INTRAVENOUS
  Filled 2017-11-17: qty 1

## 2017-11-17 MED ORDER — HYDROCODONE-ACETAMINOPHEN 5-325 MG PO TABS: 1.0000 | ORAL_TABLET | ORAL | 0 refills | Status: DC | PRN

## 2017-11-17 MED ORDER — HYDROCODONE-ACETAMINOPHEN 5-325 MG PO TABS: 1.0000 | ORAL_TABLET | ORAL | Status: DC | PRN

## 2017-11-17 NOTE — Progress Notes (Signed)
Urine strained at this time, stone appearing sediment found in bottom of screen, saved in specimen cup, in pt. Bathroom.

## 2017-11-17 NOTE — Discharge Summary (Signed)
Admission Diagnosis: IUP at 30 weeks 5 days Kidney Stone  Discharge Diagnosis: Same  Hospital Course: 32 year old G 2 P 1001 at 30 weeks 6 days presents with acute flank pain. Renal ultrasound - left 9 mm stone mid pole.  She was admitted and given IV pain medications and IVF Shortly after admission she passed several stone fragments and felt much better.  BP (!) 93/54 (BP Location: Right Arm)   Pulse 91   Temp 98.8 F (37.1 C) (Oral)   Resp 18   Ht 5\' 8"  (1.727 m)   Wt 75.6 kg (166 lb 9.6 oz)   SpO2 98%   BMI 25.33 kg/m  Results for orders placed or performed during the hospital encounter of 11/16/17 (from the past 24 hour(s))  Urinalysis, Routine w reflex microscopic     Status: Abnormal   Collection Time: 11/16/17  5:51 PM  Result Value Ref Range   Color, Urine YELLOW YELLOW   APPearance CLEAR CLEAR   Specific Gravity, Urine 1.011 1.005 - 1.030   pH 7.0 5.0 - 8.0   Glucose, UA NEGATIVE NEGATIVE mg/dL   Hgb urine dipstick NEGATIVE NEGATIVE   Bilirubin Urine NEGATIVE NEGATIVE   Ketones, ur NEGATIVE NEGATIVE mg/dL   Protein, ur NEGATIVE NEGATIVE mg/dL   Nitrite NEGATIVE NEGATIVE   Leukocytes, UA SMALL (A) NEGATIVE   RBC / HPF 0-5 0 - 5 RBC/hpf   WBC, UA 6-10 0 - 5 WBC/hpf   Bacteria, UA RARE (A) NONE SEEN   Squamous Epithelial / LPF 6-10 0 - 5   Mucus PRESENT   [facility] and [facility]     Status: None (Preliminary result)   Collection Time: 11/16/17 10:50 PM  Result Value Ref Range   ABO/RH(D) A NEG    Antibody Screen POS    Sample Expiration 11/19/2017    Antibody Identification      PASSIVELY ACQUIRED ANTI-D Performed at [facility], [address] [place], [place] [address]    Unit Number [id number]    Blood Component Type RED CELLS,LR    Unit division 00    Status of Unit ALLOCATED    Transfusion Status OK TO TRANSFUSE    Crossmatch Result COMPATIBLE    Unit Number [id number]    Blood Component Type RED CELLS,LR    Unit division 00    Status of Unit ALLOCATED    Transfusion Status OK TO TRANSFUSE    Crossmatch Result COMPATIBLE    Abdomen NT No CVAT   Patient discharged home in good condition. Clinically improved Rx Hydrocodone given to patient Follow up if pain recurs

## 2017-11-20 LAB — TYPE AND SCREEN
ABO/RH(D): A NEG
Antibody Screen: POSITIVE
UNIT DIVISION: 0
Unit division: 0

## 2017-11-20 LAB — BPAM RBC
BLOOD PRODUCT EXPIRATION DATE: 201905162359
BLOOD PRODUCT EXPIRATION DATE: 201905162359
UNIT TYPE AND RH: 600
Unit Type and Rh: 600

## 2017-11-23 LAB — STONE ANALYSIS
Ca Oxalate,Dihydrate: 10 %
Ca Oxalate,Monohydr.: 10 %
Ca phos cry stone ql IR: 80 %
STONE WEIGHT KSTONE: 24.2 mg

## 2017-12-16 LAB — OB RESULTS CONSOLE GBS: GBS: NEGATIVE

## 2018-01-17 ENCOUNTER — Encounter (HOSPITAL_COMMUNITY): Payer: Self-pay | Admitting: *Deleted

## 2018-01-17 ENCOUNTER — Telehealth (HOSPITAL_COMMUNITY): Payer: Self-pay | Admitting: *Deleted

## 2018-01-17 NOTE — Telephone Encounter (Signed)
Preadmission screen  

## 2018-01-19 ENCOUNTER — Inpatient Hospital Stay (HOSPITAL_COMMUNITY): Admit: 2018-01-19 | Payer: Self-pay

## 2018-01-19 NOTE — H&P (Signed)
Rachael Elliott Fromer is a 32 y.o. female presenting for IOL. Cervix = 1/70/-2 in office. Pregnancy complicated by kidney stones. OB History    Gravida  2   Para  1   Term  1   Preterm      AB      Living  1     SAB      TAB      Ectopic      Multiple  0   Live Births  1          Past Medical History:  Diagnosis Date  . Hx of varicella   . Kidney stone    Past Surgical History:  Procedure Laterality Date  . TONSILLECTOMY     Family History: family history includes Cancer in her maternal grandfather and mother. Social History:  reports that she has never smoked. She has never used smokeless tobacco. She reports that she does not drink alcohol or use drugs.     Maternal Diabetes: No Genetic Screening: Normal Maternal Ultrasounds/Referrals: Normal Fetal Ultrasounds or other Referrals:  None Maternal Substance Abuse:  No Significant Maternal Medications:  None Significant Maternal Lab Results:  None Other Comments:  None  Review of Systems  Eyes: Negative for blurred vision.  Gastrointestinal: Negative for abdominal pain.  Neurological: Negative for headaches.   History   unknown if currently breastfeeding. Maternal Exam:  Abdomen: Fetal presentation: vertex     Physical Exam  Cardiovascular: Normal rate and regular rhythm.  Respiratory: Effort normal and breath sounds normal.  GI: Soft. There is no tenderness.  Neurological: She has normal reflexes.    Prenatal labs: ABO, Rh: --/--/A NEG (04/24 2250) Antibody: POS (04/24 2250) Rubella: Immune (11/20 0000) RPR: Nonreactive (11/20 0000)  HBsAg: Negative (11/20 0000)  HIV: Non-reactive (11/20 0000)  GBS:   negative  Assessment/Plan: 32 yo G2P1 @ 40 1/7 weeks for IOL   Roselle LocusJames E Tyronne Blann II 01/19/2018, 4:53 PM

## 2018-01-20 ENCOUNTER — Inpatient Hospital Stay (HOSPITAL_COMMUNITY)
Admission: RE | Admit: 2018-01-20 | Discharge: 2018-01-22 | DRG: 807 | Disposition: A | Discharge status: Home / Self Care | Payer: BC Managed Care – PPO | Attending: Obstetrics and Gynecology | Admitting: Obstetrics and Gynecology

## 2018-01-20 ENCOUNTER — Inpatient Hospital Stay (HOSPITAL_COMMUNITY): Payer: BC Managed Care – PPO | Admitting: Anesthesiology

## 2018-01-20 ENCOUNTER — Encounter (HOSPITAL_COMMUNITY): Payer: Self-pay

## 2018-01-20 DIAGNOSIS — N200 Calculus of kidney: Secondary | ICD-10-CM

## 2018-01-20 DIAGNOSIS — O26833 Pregnancy related renal disease, third trimester: Secondary | ICD-10-CM

## 2018-01-20 DIAGNOSIS — Z3A40 40 weeks gestation of pregnancy: Secondary | ICD-10-CM | POA: Diagnosis not present

## 2018-01-20 DIAGNOSIS — O9989 Other specified diseases and conditions complicating pregnancy, childbirth and the puerperium: Secondary | ICD-10-CM | POA: Diagnosis present

## 2018-01-20 DIAGNOSIS — O480 Post-term pregnancy: Secondary | ICD-10-CM | POA: Diagnosis present

## 2018-01-20 LAB — CBC
HEMATOCRIT: 36.8 % (ref 36.0–46.0)
HEMOGLOBIN: 12.6 g/dL (ref 12.0–15.0)
MCH: 30.4 pg (ref 26.0–34.0)
MCHC: 34.2 g/dL (ref 30.0–36.0)
MCV: 88.9 fL (ref 78.0–100.0)
Platelets: 184 10*3/uL (ref 150–400)
RBC: 4.14 MIL/uL (ref 3.87–5.11)
RDW: 14.2 % (ref 11.5–15.5)
WBC: 12.1 10*3/uL — ABNORMAL HIGH (ref 4.0–10.5)

## 2018-01-20 LAB — RPR: RPR: NONREACTIVE

## 2018-01-20 MED ORDER — IBUPROFEN 600 MG PO TABS
600.0000 mg | ORAL_TABLET | Freq: Four times a day (QID) | ORAL | Status: DC
  Administered 2018-01-20 – 2018-01-22 (×8): 600 mg via ORAL
  Filled 2018-01-20 (×8): qty 1

## 2018-01-20 MED ORDER — TETANUS-DIPHTH-ACELL PERTUSSIS 5-2.5-18.5 LF-MCG/0.5 IM SUSP: 0.5000 mL | Freq: Once | INTRAMUSCULAR | Status: DC

## 2018-01-20 MED ORDER — TERBUTALINE SULFATE 1 MG/ML IJ SOLN: 0.2500 mg | Freq: Once | INTRAMUSCULAR | Status: DC | PRN

## 2018-01-20 MED ORDER — ONDANSETRON HCL 4 MG/2ML IJ SOLN
4.0000 mg | Freq: Four times a day (QID) | INTRAMUSCULAR | Status: DC | PRN
  Administered 2018-01-20: 4 mg via INTRAVENOUS
  Filled 2018-01-20: qty 2

## 2018-01-20 MED ORDER — ZOLPIDEM TARTRATE 5 MG PO TABS: 5.0000 mg | ORAL_TABLET | Freq: Every evening | ORAL | Status: DC | PRN

## 2018-01-20 MED ORDER — SIMETHICONE 80 MG PO CHEW: 80.0000 mg | CHEWABLE_TABLET | ORAL | Status: DC | PRN

## 2018-01-20 MED ORDER — DIPHENHYDRAMINE HCL 25 MG PO CAPS: 25.0000 mg | ORAL_CAPSULE | Freq: Four times a day (QID) | ORAL | Status: DC | PRN

## 2018-01-20 MED ORDER — OXYTOCIN 40 UNITS IN LACTATED RINGERS INFUSION - SIMPLE MED
2.5000 [IU]/h | INTRAVENOUS | Status: DC
  Administered 2018-01-20: 2.5 [IU]/h via INTRAVENOUS
  Filled 2018-01-20: qty 1000

## 2018-01-20 MED ORDER — EPHEDRINE 5 MG/ML INJ: 10.0000 mg | INTRAVENOUS | Status: DC | PRN

## 2018-01-20 MED ORDER — LACTATED RINGERS IV SOLN
INTRAVENOUS | Status: DC
  Administered 2018-01-20: 02:00:00 via INTRAVENOUS

## 2018-01-20 MED ORDER — ONDANSETRON HCL 4 MG/2ML IJ SOLN: 4.0000 mg | INTRAMUSCULAR | Status: DC | PRN

## 2018-01-20 MED ORDER — MISOPROSTOL 25 MCG QUARTER TABLET
25.0000 ug | ORAL_TABLET | ORAL | Status: DC | PRN
  Administered 2018-01-20 (×2): 25 ug via VAGINAL
  Filled 2018-01-20 (×3): qty 1

## 2018-01-20 MED ORDER — ACETAMINOPHEN 325 MG PO TABS: 650.0000 mg | ORAL_TABLET | ORAL | Status: DC | PRN

## 2018-01-20 MED ORDER — DIBUCAINE 1 % RE OINT: 1.0000 "application " | TOPICAL_OINTMENT | RECTAL | Status: DC | PRN

## 2018-01-20 MED ORDER — SOD CITRATE-CITRIC ACID 500-334 MG/5ML PO SOLN: 30.0000 mL | ORAL | Status: DC | PRN

## 2018-01-20 MED ORDER — SENNOSIDES-DOCUSATE SODIUM 8.6-50 MG PO TABS
2.0000 | ORAL_TABLET | ORAL | Status: DC
  Administered 2018-01-20 – 2018-01-22 (×2): 2 via ORAL
  Filled 2018-01-20 (×2): qty 2

## 2018-01-20 MED ORDER — OXYTOCIN BOLUS FROM INFUSION
500.0000 mL | Freq: Once | INTRAVENOUS | Status: AC
  Administered 2018-01-20: 500 mL via INTRAVENOUS

## 2018-01-20 MED ORDER — OXYCODONE HCL 5 MG PO TABS: 10.0000 mg | ORAL_TABLET | ORAL | Status: DC | PRN

## 2018-01-20 MED ORDER — BUTORPHANOL TARTRATE 1 MG/ML IJ SOLN: 1.0000 mg | INTRAMUSCULAR | Status: DC | PRN

## 2018-01-20 MED ORDER — LIDOCAINE HCL (PF) 1 % IJ SOLN
INTRAMUSCULAR | Status: DC | PRN
  Administered 2018-01-20 (×2): 5 mL via EPIDURAL

## 2018-01-20 MED ORDER — LACTATED RINGERS IV SOLN: 500.0000 mL | INTRAVENOUS | Status: DC | PRN

## 2018-01-20 MED ORDER — PHENYLEPHRINE 40 MCG/ML (10ML) SYRINGE FOR IV PUSH (FOR BLOOD PRESSURE SUPPORT)
80.0000 ug | PREFILLED_SYRINGE | INTRAVENOUS | Status: DC | PRN
  Filled 2018-01-20: qty 10

## 2018-01-20 MED ORDER — COCONUT OIL OIL: 1.0000 "application " | TOPICAL_OIL | Status: DC | PRN

## 2018-01-20 MED ORDER — DIPHENHYDRAMINE HCL 50 MG/ML IJ SOLN: 12.5000 mg | INTRAMUSCULAR | Status: DC | PRN

## 2018-01-20 MED ORDER — FLEET ENEMA 7-19 GM/118ML RE ENEM: 1.0000 | ENEMA | RECTAL | Status: DC | PRN

## 2018-01-20 MED ORDER — OXYCODONE HCL 5 MG PO TABS
5.0000 mg | ORAL_TABLET | ORAL | Status: DC | PRN
Start: 2018-01-20 — End: 2018-01-22

## 2018-01-20 MED ORDER — PRENATAL MULTIVITAMIN CH
1.0000 | ORAL_TABLET | Freq: Every day | ORAL | Status: DC
  Administered 2018-01-21 – 2018-01-22 (×2): 1 via ORAL
  Filled 2018-01-20 (×2): qty 1

## 2018-01-20 MED ORDER — OXYCODONE-ACETAMINOPHEN 5-325 MG PO TABS: 2.0000 | ORAL_TABLET | ORAL | Status: DC | PRN

## 2018-01-20 MED ORDER — BENZOCAINE-MENTHOL 20-0.5 % EX AERO
1.0000 "application " | INHALATION_SPRAY | CUTANEOUS | Status: DC | PRN
  Administered 2018-01-20 – 2018-01-22 (×2): 1 via TOPICAL
  Filled 2018-01-20 (×2): qty 56

## 2018-01-20 MED ORDER — ONDANSETRON HCL 4 MG PO TABS: 4.0000 mg | ORAL_TABLET | ORAL | Status: DC | PRN

## 2018-01-20 MED ORDER — LACTATED RINGERS IV SOLN
500.0000 mL | Freq: Once | INTRAVENOUS | Status: AC
  Administered 2018-01-20: 500 mL via INTRAVENOUS

## 2018-01-20 MED ORDER — OXYCODONE-ACETAMINOPHEN 5-325 MG PO TABS: 1.0000 | ORAL_TABLET | ORAL | Status: DC | PRN

## 2018-01-20 MED ORDER — FENTANYL 2.5 MCG/ML BUPIVACAINE 1/10 % EPIDURAL INFUSION (WH - ANES)
14.0000 mL/h | INTRAMUSCULAR | Status: DC | PRN
  Administered 2018-01-20: 14 mL/h via EPIDURAL
  Filled 2018-01-20: qty 100

## 2018-01-20 MED ORDER — WITCH HAZEL-GLYCERIN EX PADS: 1.0000 "application " | MEDICATED_PAD | CUTANEOUS | Status: DC | PRN

## 2018-01-20 MED ORDER — PHENYLEPHRINE 40 MCG/ML (10ML) SYRINGE FOR IV PUSH (FOR BLOOD PRESSURE SUPPORT): 80.0000 ug | PREFILLED_SYRINGE | INTRAVENOUS | Status: DC | PRN

## 2018-01-20 MED ORDER — LIDOCAINE HCL (PF) 1 % IJ SOLN
30.0000 mL | INTRAMUSCULAR | Status: AC | PRN
  Administered 2018-01-20: 30 mL via SUBCUTANEOUS
  Filled 2018-01-20: qty 30

## 2018-01-20 NOTE — Anesthesia Pain Management Evaluation Note (Signed)
  CRNA Pain Management Visit Note  Patient: Rachael Elliott, 32 y.o., female  "Hello I am a member of the anesthesia team at [facility]. We have an anesthesia team available at all times to provide care throughout the hospital, including epidural management and anesthesia for C-section. I don't know your plan for the delivery whether it a natural birth, water birth, IV sedation, nitrous supplementation, doula or epidural, but we want to meet your pain goals."   1.Was your pain managed to your expectations on prior hospitalizations?   Yes   2.What is your expectation for pain management during this hospitalization?     Epidural  3.How can we help you reach that goal? " Adjust my epidural so that cramping on L side of abdomen is  Less and make both legs equally numb. At the moment L>R." PCA bolus given. Epidural at 12cm. Record the patient's initial score and the patient's pain goal.   Pain: 4  Pain Goal: 1 [facility] wants you to be able to say your pain was always managed very well.  [name] 01/20/2018

## 2018-01-20 NOTE — Anesthesia Preprocedure Evaluation (Signed)

## 2018-01-20 NOTE — Anesthesia Postprocedure Evaluation (Signed)
Anesthesia Post Note  Patient: Rachael Elliott  Procedure(s) Performed: AN AD HOC LABOR EPIDURAL     Patient location during evaluation: Mother Baby Anesthesia Type: Epidural Level of consciousness: awake and alert Pain management: pain level controlled Vital Signs Assessment: post-procedure vital signs reviewed and stable Respiratory status: spontaneous breathing, nonlabored ventilation and respiratory function stable Cardiovascular status: stable Postop Assessment: no headache, no backache and epidural receding Anesthetic complications: no    Last Vitals:  Vitals:   01/20/18 1600 01/20/18 1700  BP: 117/88   Pulse: 63 64  Resp: 17 18  Temp: 36.8 C 36.9 C  SpO2: 100% 100%    Last Pain:  Vitals:   01/20/18 1700  TempSrc: Oral  PainSc:    Pain Goal:                 Junious SilkGILBERT,Felisia Balcom

## 2018-01-20 NOTE — Progress Notes (Signed)
Cx 5/90/-2/vtx FHT cat one UCs q2-3 min AROM clear

## 2018-01-20 NOTE — Anesthesia Procedure Notes (Signed)
Epidural Patient location during procedure: OB Start time: 01/20/2018 7:39 AM End time: 01/20/2018 7:49 AM  Staffing Anesthesiologist: Leonides GrillsEllender, Uriyah Raska P, MD Performed: anesthesiologist   Preanesthetic Checklist Completed: patient identified, site marked, pre-op evaluation, timeout performed, IV checked, risks and benefits discussed and monitors and equipment checked  Epidural Patient position: sitting Prep: DuraPrep Patient monitoring: heart rate, cardiac monitor, continuous pulse ox and blood pressure Approach: midline Location: L4-L5 Injection technique: LOR air  Needle:  Needle type: Tuohy  Needle gauge: 17 G Needle length: 9 cm Needle insertion depth: 5 cm Catheter type: closed end flexible Catheter size: 19 Gauge Catheter at skin depth: 10 cm Test dose: negative and Other  Assessment Events: blood not aspirated, injection not painful, no injection resistance and negative IV test  Additional Notes Informed consent obtained prior to proceeding including risk of failure, 1% risk of PDPH, risk of minor discomfort and bruising.  Discussed alternatives to epidural analgesia and patient desires to proceed.  Timeout performed pre-procedure verifying patient name, procedure, and platelet count.  Patient tolerated procedure well. Reason for block:procedure for pain

## 2018-01-20 NOTE — Progress Notes (Signed)
Delivery Note At 1:38 PM a viable female was delivered via Vaginal, Spontaneous (Presentation:OA ;  ).  APGAR: 9, 9; weight  .   Placenta status: intact, .  Cord:3 vessels  with the following complications: .  Cord pH: not done  Anesthesia:  epidural Episiotomy: None Lacerations: 2nd degree ML Suture Repair: 2.0 vicryl rapide Est. Blood Loss (mL):  250  Mom to postpartum.  Baby to Couplet care / Skin to Skin.  Rachael LocusJames E Tunya Held II 01/20/2018, 2:07 PM

## 2018-01-21 LAB — CBC
HCT: 33.7 % — ABNORMAL LOW (ref 36.0–46.0)
Hemoglobin: 11.8 g/dL — ABNORMAL LOW (ref 12.0–15.0)
MCH: 31.6 pg (ref 26.0–34.0)
MCHC: 35 g/dL (ref 30.0–36.0)
MCV: 90.3 fL (ref 78.0–100.0)
PLATELETS: 151 10*3/uL (ref 150–400)
RBC: 3.73 MIL/uL — ABNORMAL LOW (ref 3.87–5.11)
RDW: 14 % (ref 11.5–15.5)
WBC: 13.1 10*3/uL — ABNORMAL HIGH (ref 4.0–10.5)

## 2018-01-21 MED ORDER — RHO D IMMUNE GLOBULIN 1500 UNIT/2ML IJ SOSY
300.0000 ug | PREFILLED_SYRINGE | Freq: Once | INTRAMUSCULAR | Status: AC
  Administered 2018-01-21: 300 ug via INTRAVENOUS
  Filled 2018-01-21: qty 2

## 2018-01-21 NOTE — Progress Notes (Signed)
Post Partum Day 1 Subjective: no complaints, up ad lib, voiding, tolerating PO and + flatus  Objective: Blood pressure 108/83, pulse 66, temperature 97.6 F (36.4 C), resp. rate 18, height 5\' 8"  (1.727 m), weight 176 lb 4.8 oz (80 kg), SpO2 97 %, unknown if currently breastfeeding.  Physical Exam:  General: alert, cooperative and no distress Lochia: appropriate Uterine Fundus: firm Incision: healing well DVT Evaluation: No evidence of DVT seen on physical exam.  Recent Labs    01/20/18 0127 01/21/18 0556  HGB 12.6 11.8*  HCT 36.8 33.7*    Assessment/Plan: Plan for discharge tomorrow   LOS: 1 day   Roselle LocusJames E Damont Balles II 01/21/2018, 9:12 AM

## 2018-01-21 NOTE — Plan of Care (Signed)
Pt. Condition will continue to improve 

## 2018-01-22 LAB — RH IG WORKUP (INCLUDES ABO/RH)
ABO/RH(D): A NEG
FETAL SCREEN: NEGATIVE
Gestational Age(Wks): 40.1
UNIT DIVISION: 0

## 2018-01-22 MED ORDER — IBUPROFEN 600 MG PO TABS: 600.0000 mg | ORAL_TABLET | Freq: Four times a day (QID) | ORAL | 0 refills | Status: DC | PRN

## 2018-01-22 MED ORDER — ACETAMINOPHEN 325 MG PO TABS: 650.0000 mg | ORAL_TABLET | Freq: Four times a day (QID) | ORAL | 0 refills | Status: DC | PRN

## 2018-01-22 NOTE — Discharge Summary (Signed)
Obstetric Discharge Summary Reason for Admission: induction of labor Prenatal Procedures: none Intrapartum Procedures: spontaneous vaginal delivery Postpartum Procedures: none Complications-Operative and Postpartum: none Hemoglobin  Date Value Ref Range Status  01/21/2018 11.8 (L) 12.0 - 15.0 g/dL Final   HCT  Date Value Ref Range Status  01/21/2018 33.7 (L) 36.0 - 46.0 % Final    Physical Exam:  General: alert, cooperative and no distress Lochia: appropriate Uterine Fundus: firm Incision: healing well DVT Evaluation: No evidence of DVT seen on physical exam.  Discharge Diagnoses: Term Pregnancy-delivered  Discharge Information: Date: 01/22/2018 Activity: pelvic rest Diet: routine Medications: PNV and Ibuprofen Condition: stable Instructions: refer to practice specific booklet Discharge to: home   Newborn Data: Live born female  Birth Weight: 7 lb 1.2 oz (3210 g) APGAR: 9, 9  Newborn Delivery   Birth date/time:  01/20/2018 13:38:00 Delivery type:  Vaginal, Spontaneous     Home with mother.  Roselle LocusJames E Zayaan Kozak II 01/22/2018, 8:28 AM

## 2018-01-22 NOTE — Progress Notes (Signed)
Post Partum Day 2 Subjective: no complaints, up ad lib, voiding, tolerating PO and + flatus  Objective: Blood pressure 105/71, pulse 67, temperature 97.7 F (36.5 C), temperature source Oral, resp. rate 16, height 5\' 8"  (1.727 m), weight 176 lb 4.8 oz (80 kg), SpO2 100 %, unknown if currently breastfeeding.  Physical Exam:  General: alert, cooperative and no distress Lochia: appropriate Uterine Fundus: firm Incision: healing well DVT Evaluation: No evidence of DVT seen on physical exam.  Recent Labs    01/20/18 0127 01/21/18 0556  HGB 12.6 11.8*  HCT 36.8 33.7*    Assessment/Plan: Discharge home   LOS: 2 days   Roselle LocusJames E Cyani Kallstrom II 01/22/2018, 8:26 AM

## 2018-01-24 LAB — BPAM RBC
BLOOD PRODUCT EXPIRATION DATE: 201907202359
Blood Product Expiration Date: 201907202359
UNIT TYPE AND RH: 600
Unit Type and Rh: 600

## 2018-01-24 LAB — TYPE AND SCREEN
ABO/RH(D): A NEG
Antibody Screen: POSITIVE
UNIT DIVISION: 0
Unit division: 0

## 2020-01-07 IMAGING — US US RENAL
1 series · 15 of 25 positions shown · non-contrast
Comparison: None.

CLINICAL DATA: Back pain, 30.6 weeks pregnant

EXAM:
RENAL / URINARY TRACT ULTRASOUND COMPLETE

[Series 1: us renal · 15 of 33 slices shown]
[im 1/33]
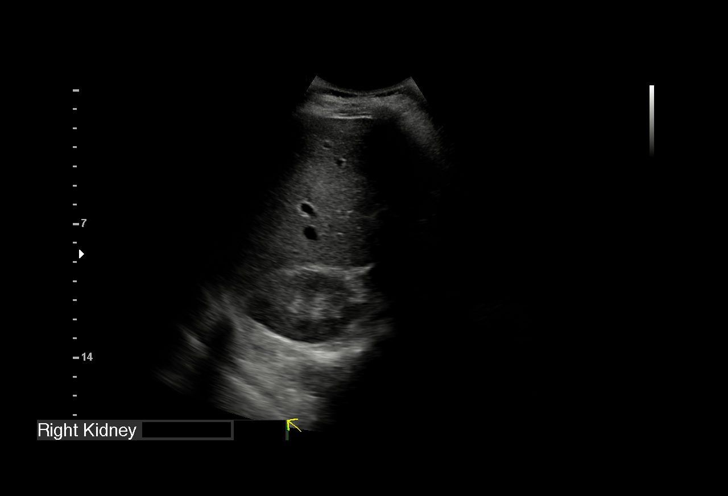
[im 3/33]
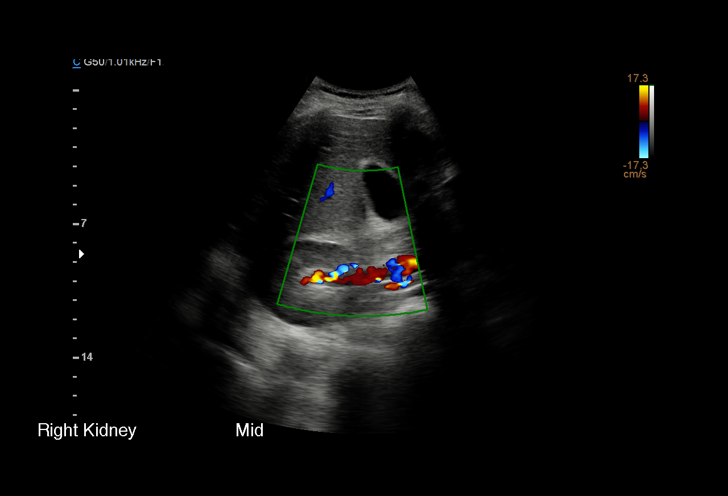
[im 6/33]
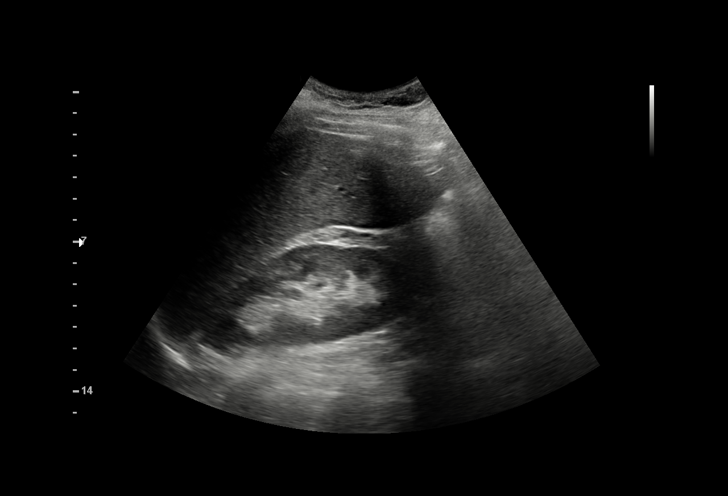
[im 7/33]
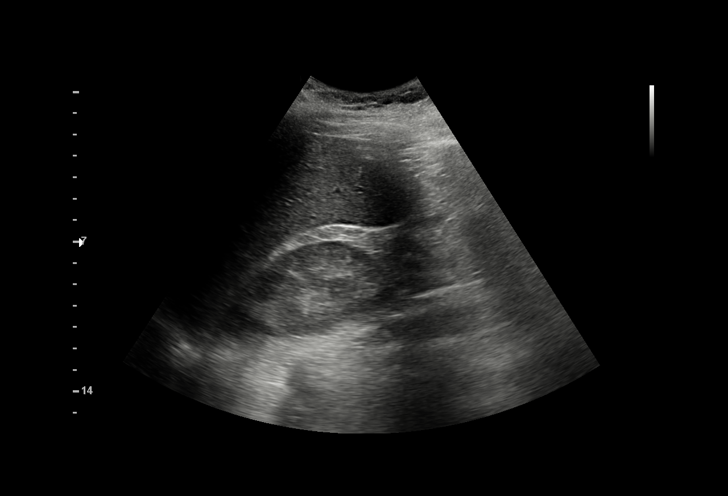
[im 10/33]
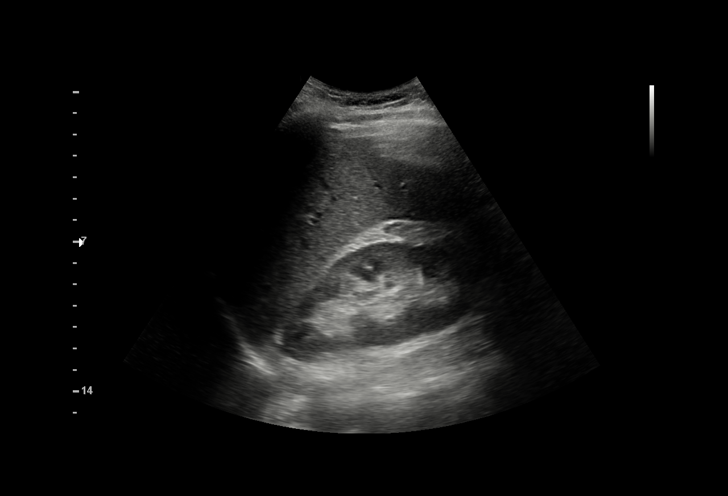
[im 13/33]
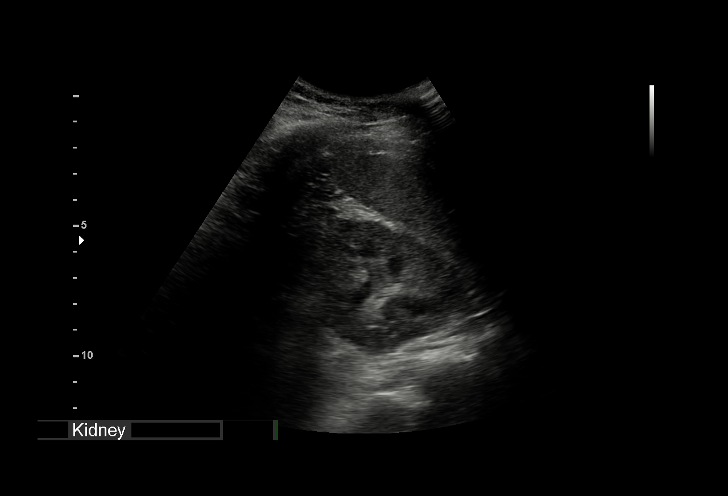
[im 14/33]
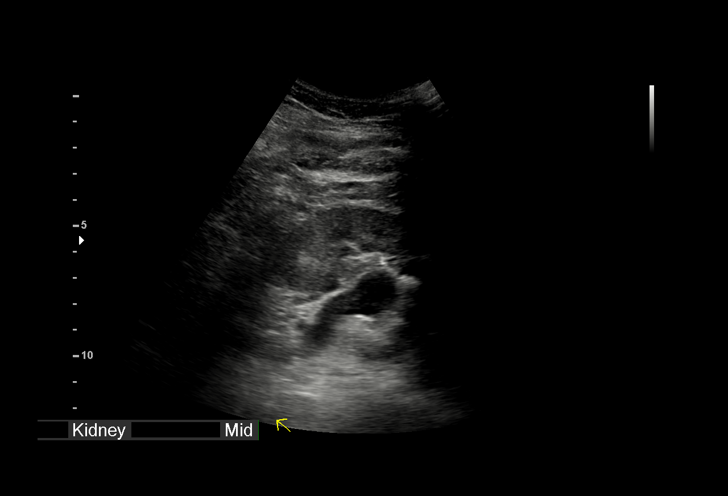
[im 17/33]
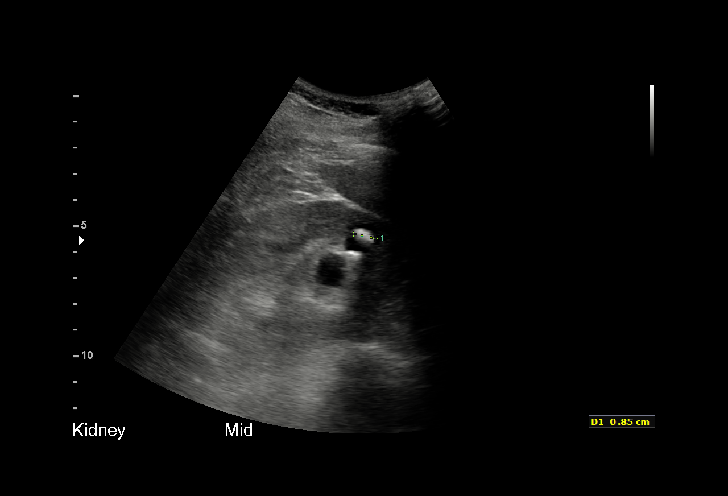
[im 19/33]
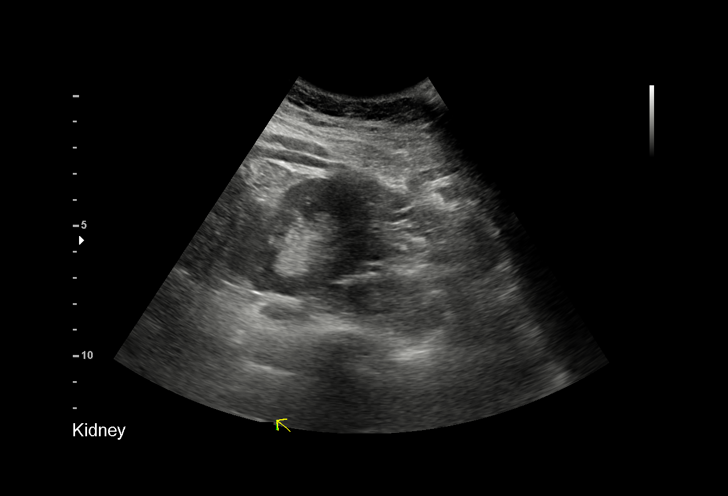
[im 21/33]
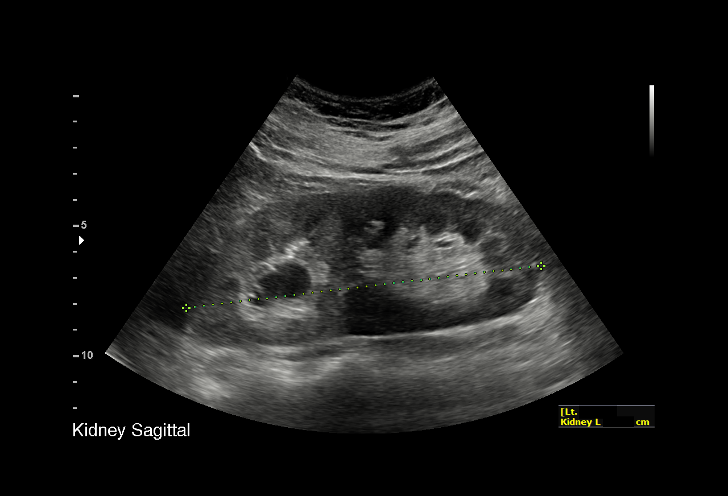
[im 23/33]
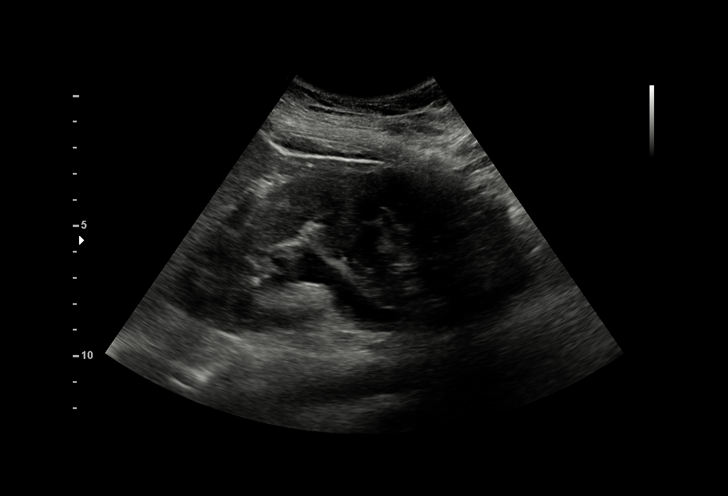
[im 26/33]
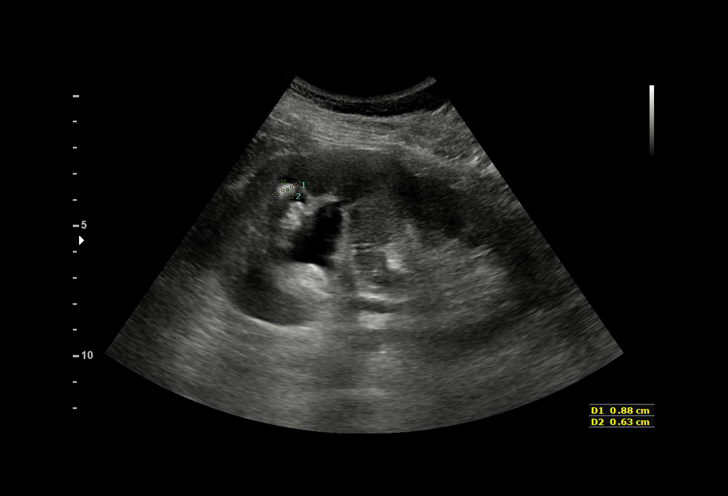
[im 27/33]
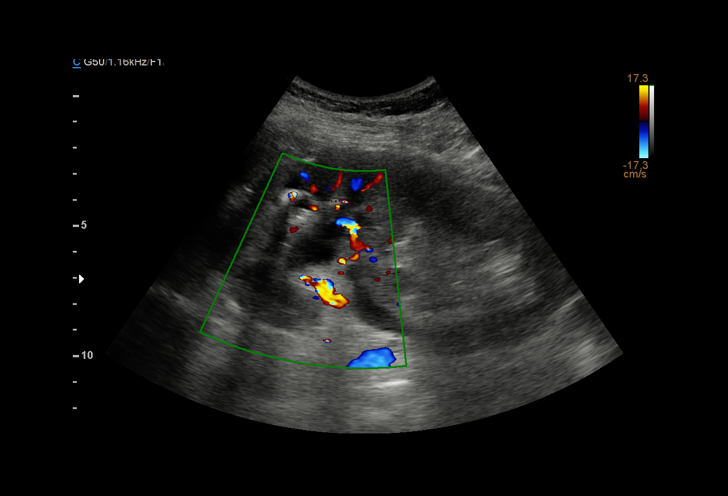
[im 30/33]
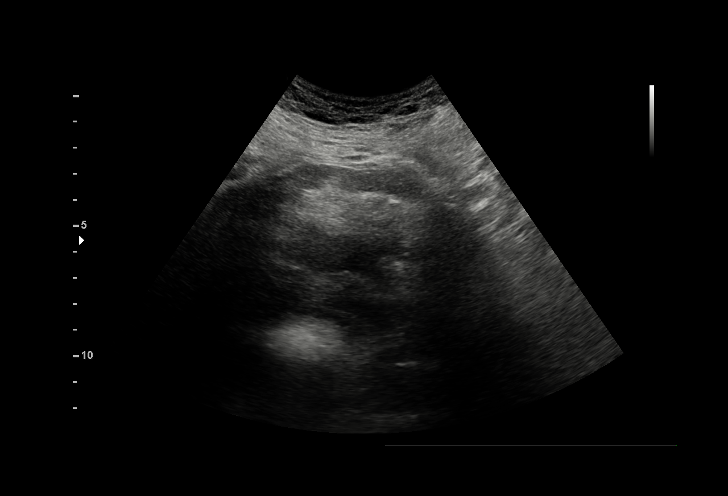
[im 33/33]
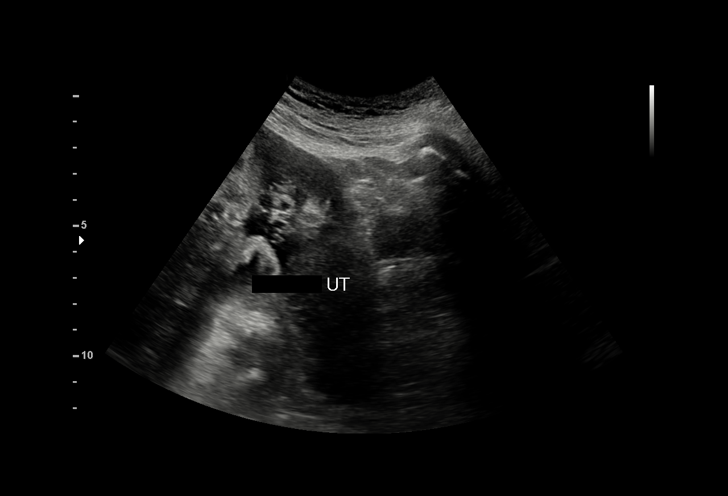

[15 of 25 positions shown; findings below may reference images not displayed]

FINDINGS: Right Kidney:

Length: 10.8 cm. Echogenicity within normal limits. No mass or
hydronephrosis visualized.

Left Kidney:

Length: 13.7 cm. Echogenicity within normal limits. No mass. 9 mm
shadowing stone in the mid pole. Mild hydronephrosis in the upper
pole; questionable duplex configuration.

Bladder:

Appears empty.
IMPRESSION: 1. 9 mm stone in the midpole of the left kidney. There is mild
hydronephrosis of the upper pole of left kidney. Asymmetric left
larger than right kidney with hydronephrosis involving only the
upper pole raises possibility of duplex configuration of left
kidney.

## 2021-06-29 ENCOUNTER — Other Ambulatory Visit: Payer: Self-pay | Admitting: Obstetrics and Gynecology

## 2021-06-29 DIAGNOSIS — R928 Other abnormal and inconclusive findings on diagnostic imaging of breast: Secondary | ICD-10-CM

## 2021-07-28 ENCOUNTER — Other Ambulatory Visit: Payer: BC Managed Care – PPO

## 2022-06-01 ENCOUNTER — Other Ambulatory Visit: Payer: Self-pay

## 2022-06-01 ENCOUNTER — Encounter: Payer: Self-pay | Admitting: Otolaryngology

## 2022-06-02 NOTE — Discharge Instructions (Signed)
[facility] ENDOSCOPIC SINUS SURGERY [facility]  What is Functional Endoscopic Sinus Surgery?  The Surgery involves making the natural openings of the sinuses larger by removing the bony partitions that separate the sinuses from the nasal cavity.  The natural sinus lining is preserved as much as possible to allow the sinuses to resume normal function after the surgery.  In some patients nasal polyps (excessively swollen lining of the sinuses) may be removed to relieve obstruction of the sinus openings.  The surgery is performed through the nose using lighted scopes, which eliminates the need for incisions on the face.  A septoplasty is a different procedure which is sometimes performed with sinus surgery.  It involves straightening the boy partition that separates the two sides of your nose.  A crooked or deviated septum may need repair if is obstructing the sinuses or nasal airflow.  Turbinate reduction is also often performed during sinus surgery.  The turbinates are bony proturberances from the side walls of the nose which swell and can obstruct the nose in patients with sinus and allergy problems.  Their size can be surgically reduced to help relieve nasal obstruction.  What Can Sinus Surgery Do For Me?  Sinus surgery can reduce the frequency of sinus infections requiring antibiotic treatment.  This can provide improvement in nasal congestion, post-nasal drainage, facial pressure and nasal obstruction.  Surgery will NOT prevent you from ever having an infection again, so it usually only for patients who get infections 4 or more times yearly requiring antibiotics, or for infections that do not clear with antibiotics.  It will not cure nasal allergies, so patients with allergies may still require medication to treat their allergies after surgery. Surgery may improve headaches related to sinusitis, however, some people will continue to  require medication to control sinus headaches related to allergies.  Surgery will do nothing for other forms of headache (migraine, tension or cluster).  What Are the Risks of Endoscopic Sinus Surgery?  Current techniques allow surgery to be performed safely with little risk, however, there are rare complications that patients should be aware of.  Because the sinuses are located around the eyes, there is risk of eye injury, including blindness, though again, this would be quite rare. This is usually a result of bleeding behind the eye during surgery, which can effect vision, though there are treatments to protect the vision and prevent permanent injury. More serious complications would include bleeding inside the brain cavity or damage to the brain.This happens when the fluid around the brain leaks out into the sinus cavity.  Again, all of these complications are uncommon, and spinal fluid leaks can be safely managed surgically if they occur.  The most common complication of sinus surgery is bleeding from the nose, which may require packing or cauterization of the nose.  Patients with polyps may experience recurrence of the polyps that would require revision surgery.  Alterations of sense of smell or injury to the tear ducts are also rare complications.   What is the Surgery Like, and what is the Recovery?  The Surgery usually takes a couple of hours to perform, and is usually performed under a general anesthetic (completely asleep).  Patients are usually discharged home after a couple of hours.  Sometimes during surgery it is necessary to pack the nose to control bleeding, and the packing is left in place for 24 - 48 hours, and removed by your surgeon.  If   a septoplasty was performed during the procedure, there is often a splint placed which must be removed after 5-7 days.   Discomfort: Pain is usually mild to moderate, and can be controlled by prescription pain medication or acetaminophen (Tylenol).   Aspirin, Ibuprofen (Advil, Motrin), or Naprosyn (Aleve) should be avoided, as they can cause increased bleeding.  Most patients feel sinus pressure like they have a bad head cold for several days.  Sleeping with your head elevated can help reduce swelling and facial pressure, as can ice packs over the face.  A humidifier may be helpful to keep the mucous and blood from drying in the nose.   Diet: There are no specific diet restrictions, however, you should generally start with clear liquids and a light diet of bland foods because the anesthetic can cause some nausea.  Advance your diet depending on how your stomach feels.  Taking your pain medication with food will often help reduce stomach upset which pain medications can cause.  Nasal Saline Irrigation: It is important to remove blood clots and dried mucous from the nose as it is healing.  This is done by having you irrigate the nose at least 3 - 4 times daily with a salt water solution.  We recommend using NeilMed Sinus Rinse (available at the drug store).  Fill the squeeze bottle with the solution, bend over a sink, and insert the tip of the squeeze bottle into the nose  of an inch.  Point the tip of the squeeze bottle towards the inside corner of the eye on the same side your irrigating.  Squeeze the bottle and gently irrigate the nose.  If you bend forward as you do this, most of the fluid will flow back out of the nose, instead of down your throat.   The solution should be warm, near body temperature, when you irrigate.   Each time you irrigate, you should use a full squeeze bottle.   Note that if you are instructed to use Nasal Steroid Sprays at any time after your surgery, irrigate with saline BEFORE using the steroid spray, so you do not wash it all out of the nose. Another product, Nasal Saline Gel (such as AYR Nasal Saline Gel) can be applied in each nostril 3 - 4 times daily to moisture the nose and reduce scabbing or crusting.  Bleeding:   Bloody drainage from the nose can be expected for several days, and patients are instructed to irrigate their nose frequently with salt water to help remove mucous and blood clots.  The drainage may be dark red or brown, though some fresh blood may be seen intermittently, especially after irrigation.  Do not blow you nose, as bleeding may occur. If you must sneeze, keep your mouth open to allow air to escape through your mouth.  If heavy bleeding occurs: Irrigate the nose with saline to rinse out clots, then spray the nose 3 - 4 times with Afrin Nasal Decongestant Spray.  The spray will constrict the blood vessels to slow bleeding.  Pinch the lower half of your nose shut to apply pressure, and lay down with your head elevated.  Ice packs over the nose may help as well. If bleeding persists despite these measures, you should notify your doctor.  Do not use the Afrin routinely to control nasal congestion after surgery, as it can result in worsening congestion and may affect healing.     Activity: Return to work varies among patients. Most patients will be out   of work at least 5 - 7 days to recover.  Patient may return to work after they are off of narcotic pain medication, and feeling well enough to perform the functions of their job.  Patients must avoid heavy lifting (over 10 pounds) or strenuous physical for 2 weeks after surgery, so your employer may need to assign you to light duty, or keep you out of work longer if light duty is not possible.  NOTE: you should not drive, operate dangerous machinery, do any mentally demanding tasks or make any important legal or financial decisions while on narcotic pain medication and recovering from the general anesthetic.    Call Your Doctor Immediately if You Have Any of the Following: Bleeding that you cannot control with the above measures Loss of vision, double vision, bulging of the eye or black eyes. Fever over 101 degrees Neck stiffness with severe headache,  fever, nausea and change in mental state. You are always encouraged to call anytime with concerns, however, please call with requests for pain medication refills during office hours.  Office Endoscopy: During follow-up visits your doctor will remove any packing or splints that may have been placed and evaluate and clean your sinuses endoscopically.  Topical anesthetic will be used to make this as comfortable as possible, though you may want to take your pain medication prior to the visit.  How often this will need to be done varies from patient to patient.  After complete recovery from the surgery, you may need follow-up endoscopy from time to time, particularly if there is concern of recurrent infection or nasal polyps.  

## 2022-06-09 ENCOUNTER — Ambulatory Visit: Payer: BC Managed Care – PPO | Admitting: Anesthesiology

## 2022-06-09 ENCOUNTER — Encounter
Admission: RE | Disposition: A | Discharge status: Home / Self Care | Payer: Self-pay | Source: Home / Self Care | Attending: Otolaryngology

## 2022-06-09 ENCOUNTER — Ambulatory Visit
Admission: RE | Admit: 2022-06-09 | Discharge: 2022-06-09 | Disposition: A | Discharge status: Home / Self Care | Payer: BC Managed Care – PPO | Attending: Otolaryngology | Admitting: Otolaryngology

## 2022-06-09 ENCOUNTER — Other Ambulatory Visit: Payer: Self-pay

## 2022-06-09 ENCOUNTER — Encounter: Payer: Self-pay | Admitting: Otolaryngology

## 2022-06-09 DIAGNOSIS — J343 Hypertrophy of nasal turbinates: Secondary | ICD-10-CM | POA: Insufficient documentation

## 2022-06-09 DIAGNOSIS — J328 Other chronic sinusitis: Secondary | ICD-10-CM | POA: Insufficient documentation

## 2022-06-09 DIAGNOSIS — J342 Deviated nasal septum: Secondary | ICD-10-CM | POA: Diagnosis present

## 2022-06-09 HISTORY — PX: IMAGE GUIDED SINUS SURGERY: SHX6570

## 2022-06-09 HISTORY — PX: MAXILLARY ANTROSTOMY: SHX2003

## 2022-06-09 HISTORY — PX: TURBINATE REDUCTION: SHX6157

## 2022-06-09 HISTORY — PX: SPHENOIDECTOMY: SHX2421

## 2022-06-09 HISTORY — PX: ETHMOIDECTOMY: SHX5197

## 2022-06-09 HISTORY — PX: FRONTAL SINUS EXPLORATION: SHX6591

## 2022-06-09 LAB — POCT PREGNANCY, URINE: Preg Test, Ur: NEGATIVE

## 2022-06-09 SURGERY — SINUS SURGERY, WITH IMAGING GUIDANCE
Anesthesia: General | Laterality: Right

## 2022-06-09 MED ORDER — PROPOFOL 10 MG/ML IV BOLUS
INTRAVENOUS | Status: DC | PRN
  Administered 2022-06-09: 150 mg via INTRAVENOUS

## 2022-06-09 MED ORDER — HYDROMORPHONE HCL 1 MG/ML IJ SOLN: 0.2500 mg | INTRAMUSCULAR | Status: DC | PRN

## 2022-06-09 MED ORDER — ROCURONIUM BROMIDE 100 MG/10ML IV SOLN
INTRAVENOUS | Status: DC | PRN
  Administered 2022-06-09: 40 mg via INTRAVENOUS

## 2022-06-09 MED ORDER — DEXAMETHASONE SODIUM PHOSPHATE 4 MG/ML IJ SOLN
INTRAMUSCULAR | Status: DC | PRN
  Administered 2022-06-09: 8 mg via INTRAVENOUS

## 2022-06-09 MED ORDER — SUGAMMADEX SODIUM 200 MG/2ML IV SOLN
INTRAVENOUS | Status: DC | PRN
  Administered 2022-06-09: 145.2 mg via INTRAVENOUS

## 2022-06-09 MED ORDER — OXYCODONE HCL 5 MG/5ML PO SOLN
5.0000 mg | Freq: Once | ORAL | Status: AC | PRN
  Administered 2022-06-09: 5 mg via ORAL

## 2022-06-09 MED ORDER — HEMOSTATIC AGENTS (NO CHARGE) OPTIME
TOPICAL | Status: DC | PRN
  Administered 2022-06-09: 1 via TOPICAL

## 2022-06-09 MED ORDER — LACTATED RINGERS IV SOLN: INTRAVENOUS | Status: DC

## 2022-06-09 MED ORDER — LIDOCAINE-EPINEPHRINE 1 %-1:100000 IJ SOLN
INTRAMUSCULAR | Status: DC | PRN
  Administered 2022-06-09: 6 mL

## 2022-06-09 MED ORDER — OXYMETAZOLINE HCL 0.05 % NA SOLN
NASAL | Status: DC | PRN
  Administered 2022-06-09: 1 via TOPICAL

## 2022-06-09 MED ORDER — EPHEDRINE SULFATE (PRESSORS) 50 MG/ML IJ SOLN
INTRAMUSCULAR | Status: DC | PRN
  Administered 2022-06-09: 10 mg via INTRAVENOUS

## 2022-06-09 MED ORDER — OXYCODONE-ACETAMINOPHEN 5-325 MG PO TABS: 1.0000 | ORAL_TABLET | ORAL | 0 refills | Status: DC | PRN

## 2022-06-09 MED ORDER — ONDANSETRON HCL 4 MG/2ML IJ SOLN
INTRAMUSCULAR | Status: DC | PRN
  Administered 2022-06-09: 4 mg via INTRAVENOUS

## 2022-06-09 MED ORDER — OXYCODONE HCL 5 MG PO TABS: 5.0000 mg | ORAL_TABLET | Freq: Once | ORAL | Status: AC | PRN

## 2022-06-09 MED ORDER — ONDANSETRON HCL 4 MG PO TABS: 4.0000 mg | ORAL_TABLET | Freq: Three times a day (TID) | ORAL | 0 refills | Status: DC | PRN

## 2022-06-09 MED ORDER — FENTANYL CITRATE (PF) 100 MCG/2ML IJ SOLN
INTRAMUSCULAR | Status: DC | PRN
  Administered 2022-06-09: 100 ug via INTRAVENOUS

## 2022-06-09 MED ORDER — PROPOFOL 500 MG/50ML IV EMUL
INTRAVENOUS | Status: DC | PRN
  Administered 2022-06-09: 100 ug/kg/min via INTRAVENOUS

## 2022-06-09 MED ORDER — MIDAZOLAM HCL 5 MG/5ML IJ SOLN
INTRAMUSCULAR | Status: DC | PRN
  Administered 2022-06-09: 2 mg via INTRAVENOUS

## 2022-06-09 MED ORDER — AMOXICILLIN-POT CLAVULANATE 875-125 MG PO TABS: 1.0000 | ORAL_TABLET | Freq: Two times a day (BID) | ORAL | 0 refills | Status: AC

## 2022-06-09 MED ORDER — LIDOCAINE HCL (CARDIAC) PF 100 MG/5ML IV SOSY
PREFILLED_SYRINGE | INTRAVENOUS | Status: DC | PRN
  Administered 2022-06-09: 60 mg via INTRAVENOUS

## 2022-06-09 SURGICAL SUPPLY — 30 items
BLADE SHAVER TRUDI 4 15 DEG (ENT DISPOSABLE) IMPLANT
BLADE SHAVER TRUDI 4 40 DEG (ENT DISPOSABLE) IMPLANT
BLADE SHAVER TRUDI STR 4 (ENT DISPOSABLE) IMPLANT
CABLE TRUDI DISPOSABLE (ENT DISPOSABLE) ×6 IMPLANT
CANISTER SUCT 1200ML W/VALVE (MISCELLANEOUS) ×3 IMPLANT
COAGULATOR SUCT 8FR VV (MISCELLANEOUS) IMPLANT
DEVICE INFLATION SEID (MISCELLANEOUS) IMPLANT
DRESSING NASL FOAM PST OP SINU (MISCELLANEOUS) IMPLANT
DRSG NASAL FOAM POST OP SINU (MISCELLANEOUS) ×3
ELECT REM PT RETURN 9FT ADLT (ELECTROSURGICAL) ×3
ELECTRODE REM PT RTRN 9FT ADLT (ELECTROSURGICAL) ×3 IMPLANT
GLOVE SURG GAMMEX PI TX LF 7.5 (GLOVE) ×6 IMPLANT
GOWN STRL REUS W/ TWL LRG LVL3 (GOWN DISPOSABLE) ×3 IMPLANT
GOWN STRL REUS W/TWL LRG LVL3 (GOWN DISPOSABLE) ×3
IV NS 500ML (IV SOLUTION) ×3
IV NS 500ML BAXH (IV SOLUTION) ×3 IMPLANT
KIT TURNOVER KIT A (KITS) ×3 IMPLANT
NS IRRIG 500ML POUR BTL (IV SOLUTION) ×3 IMPLANT
PACK ENT CUSTOM (PACKS) ×3 IMPLANT
PACKING NASAL EPIS 4X2.4 XEROG (MISCELLANEOUS) IMPLANT
PATTIES SURGICAL .5 X3 (DISPOSABLE) ×3 IMPLANT
SOL ANTI-FOG 6CC FOG-OUT (MISCELLANEOUS) ×3 IMPLANT
STRAP BODY AND KNEE 60X3 (MISCELLANEOUS) ×3 IMPLANT
SUCTION COAG ELEC 10 HAND CTRL (ELECTROSURGICAL) IMPLANT
SYR 10ML LL (SYRINGE) ×3 IMPLANT
SYR EAR/ULCER 2OZ (SYRINGE) ×3 IMPLANT
SYSTEM BALLOON SINUPLASTY 6X16 (SINUPLASTY) IMPLANT
TRACKER DISPOSABLE PAITIENT (MISCELLANEOUS) ×3 IMPLANT
TUBING IRRIGATION BIEN-AIR (TUBING) ×3 IMPLANT
WATER STERILE IRR 250ML POUR (IV SOLUTION) IMPLANT

## 2022-06-09 NOTE — Anesthesia Postprocedure Evaluation (Signed)
Anesthesia Post Note  Patient: Blondina Coderre  Procedure(s) Performed: IMAGE GUIDED SINUS SURGERY MAXILLARY ANTROSTOMY WITHOUT TISSUE REMOVAL (Right) TOTAL ETHMOIDECTOMY (Right) SPHENOIDECTOMY (Bilateral) FRONTAL SINUS EXPLORATION (Right) INFERIOR TURBINATE REDUCTION (Bilateral)  Patient location during evaluation: PACU Anesthesia Type: General Level of consciousness: awake and alert Pain management: pain level controlled Vital Signs Assessment: post-procedure vital signs reviewed and stable Respiratory status: spontaneous breathing, nonlabored ventilation, respiratory function stable and patient connected to nasal cannula oxygen Cardiovascular status: blood pressure returned to baseline and stable Postop Assessment: no apparent nausea or vomiting Anesthetic complications: no   No notable events documented.   Last Vitals:  Vitals:   06/09/22 1130 06/09/22 1141  BP: (!) 156/95 (!) 145/95  Pulse: 69 71  Resp: 11 10  Temp:  (!) 36.3 C  SpO2: 98% 98%    Last Pain:  Vitals:   06/09/22 1141  TempSrc:   PainSc: Asleep                 Louie Boston

## 2022-06-09 NOTE — Anesthesia Preprocedure Evaluation (Signed)
Anesthesia Evaluation  Patient identified by MRN, date of birth, ID band Patient awake    Reviewed: Allergy & Precautions, NPO status , Patient's Chart, lab work & pertinent test results  History of Anesthesia Complications Negative for: history of anesthetic complications  Airway Mallampati: II  TM Distance: >3 FB Neck ROM: full    Dental  (+) Teeth Intact   Pulmonary neg pulmonary ROS   Pulmonary exam normal        Cardiovascular negative cardio ROS Normal cardiovascular exam     Neuro/Psych  Headaches  negative psych ROS   GI/Hepatic negative GI ROS, Neg liver ROS,,,  Endo/Other  negative endocrine ROS    Renal/GU      Musculoskeletal   Abdominal   Peds  Hematology negative hematology ROS (+)   Anesthesia Other Findings Past Medical History: No date: Headache     Comment:  Every couple of days No date: Hx of varicella No date: Kidney stone     Comment:  one time  Past Surgical History: No date: MYRINGOTOMY WITH TUBE PLACEMENT     Comment:  as a child No date: TONSILLECTOMY  BMI    Body Mass Index: 24.33 kg/m      Reproductive/Obstetrics negative OB ROS                             Anesthesia Physical Anesthesia Plan  ASA: 2  Anesthesia Plan: General ETT   Post-op Pain Management: Tylenol PO (post-op), Toradol IV (intra-op) and Dilaudid IV   Induction: Intravenous  PONV Risk Score and Plan: Ondansetron, Dexamethasone, Midazolam and Treatment may vary due to age or medical condition  Airway Management Planned: Oral ETT  Additional Equipment:   Intra-op Plan:   Post-operative Plan: Extubation in OR  Informed Consent: I have reviewed the patients History and Physical, chart, labs and discussed the procedure including the risks, benefits and alternatives for the proposed anesthesia with the patient or authorized representative who has indicated his/her  understanding and acceptance.     Dental Advisory Given  Plan Discussed with: Anesthesiologist, CRNA and Surgeon  Anesthesia Plan Comments: (Patient consented for risks of anesthesia including but not limited to:  - adverse reactions to medications - damage to eyes, teeth, lips or other oral mucosa - nerve damage due to positioning  - sore throat or hoarseness - Damage to heart, brain, nerves, lungs, other parts of body or loss of life  Patient voiced understanding.)       Anesthesia Quick Evaluation

## 2022-06-09 NOTE — H&P (Signed)
..  History and Physical paper copy reviewed and updated date of procedure and will be scanned into system.  Patient seen and examined.  

## 2022-06-09 NOTE — Op Note (Signed)
..06/09/2022  10:59 AM    Rachael Elliott  [id number]    Pre-Op Dx:  Deviated Nasal Septum, Hypertrophic Inferior Turbinates, chronic maxillary, frontal, and ethmoid and sphenoid sinusitis  Post-op Dx: Same  Proc: 1)  Right Maxillary Antrostomy  2)  Right total ethmoidectomy  3)  Right Frontal Sinus exploration  4)  Bilateral Sphenoidotomy  5)  Bilateral Inferior turbinate reduction  6)  Image Guided Sinus Surgery  Surg:  Roney Mans Riane Rung  Anes:  GOT  EBL:  58ml  Comp:  none  Findings: Successful Right maxillary antrostomy, right total ethmoid, widely patent frontal sinus outflow tract, bilateral widely patent sphenoidotomy, Enlarged bilateral turbinates successfully reduced.  Procedure: With the patient in a comfortable supine position,  general orotracheal anesthesia was induced without difficulty.  The patient received preoperative Afrin spray for topical decongestion and vasoconstriction.  At an appropriate level, the patient was placed in a semi-sitting position.  Nasal vibrissae were trimmed.   1% Xylocaine with 1:100,000 epinephrine, 6 cc's, was infiltrated into the anterior floor of the nose, into the nasal spine region, into the membranous columella, and finally into the submucoperichondrial plane of the septum on both sides.  Several minutes were allowed for this to take effect.  Cottoniod pledgetts soaked in Afrin were placed into both nasal cavities and left while the patient was prepped and draped in the standard fashion.   A proper time-out was performed.  The Acclarent image guidance system was set up and calibrated in the normal fashion..   The materials were removed from the nose and observed to be intact and correct in number.  The nose was inspected with a headlight and zero degree endoscope with the findings as described above.  The inferior turbinates were then inspected.  Under endoscopic visualization, the inferior turbinates were infractured  bilaterally with a Therapist, nutritional.  A kelly clamp was attached to the anterior-inferior third of each inferior turbinate for approximately one minute.  Under endoscopic visualization, Tru-cutting forceps were used to remove the anterior-inferior third of each inferior turbinate.  Electrocautery was used to control bleeding in the area. The remaining turbinate was then outfractured to open up the airway further. There was no significant bleeding noted. The right turbinate was then trimmed and outfractured in a similar fashion.  The airways were then visualized and showed open passageways on both sides that were significantly improved compared to before surgery.       At this point, attention was directed to the functional endoscopic sinus surgery aspect of the procedure.  One the right side, the nose was next inspected with a zero degree endoscope and the middle turbinate was medialized and afrin soaked pledgets were placed lateral to the turbinates for approximately one minute.  The uncinate process was infractured with a maxillary ostia seeker.  The Acclarent balloon sinuplasty device was next placed behind the uncinate process and the guide wire was threaded into the frontal sinus with proper transillumination.  The sinus tract was then dilated 3X for a patent frontal sinus.  At this time a currette and Giraffe probe was used to remove the fractured ager nasi cells for a widely patient frontal sinus outflow tract.  This was evaluated with 0 degree endoscope and fragements of residual ager nasi cells were removed with 45 degree up-biting forceps.  At this time attention was directed to the patient's right maxillary sinus.  A ball tipped probe was placed through the natural ostia and this was used to create a larger  opening.  Using a Diego microdebrider, the maxillary antrostomy was enlarged for a widely patent maxillary antrostomy.  Hemostasis was performed with topical Afrin soaked pledgets.  Visualization  with a zero degree endoscope was used to examine the bilateral maxillary antrostomies which were noted to be widely patent and in continuity with the natural os bilaterally.  Next attention was directed to the patient's right ethmoid sinus.  The ethmoid bulla was entered with a straight image guidance suction.  The ethmoid cells were opened from a medial and inferior position superior and laterally until the vertical lamella was encountered.  This was opened and the posterior ethmoid was opened in a similar fashion.  All fragments of bone and mucosa were removed with straight biting forceps.  The skull base was identified and trauma was avoided.  Image guidance was used throughout to ensure all diseased air cells were opened.  Attention at this time was directed to the patient's sphenoid sinuses.  On the patient's left side, the middle turbinate was lateralized and the superior turbinate was identified.  The sphenoid os was visualized and the op was sequentially dilated with an acclarent balloon device and then elarged with 45 degree biting forceps and straight suction.  This was repeated on the patient's opposite side again with creation of a large sphenoid ostia bilaterally.  At this time with all diseased sinuses opened, the patient's nasal cavity was examinated and copiously irrigated with sterile saline.  Meticulous hemostasis was continued and all sinuses were examined and noted to be widely patent.    Stamberger sinufoam was placed along the cut edge of the inferior turbinates and within the total ethmoid space and along the maxillary antrostomy bilaterally.  Xerogel was placed within the total ethmoid cavity for medialization of the middle turbinate on the right.  he patient was turned back over to anesthesia, and awakened, extubated, and taken to the PACU in satisfactory condition.  Dispo:   PACU to home  Plan: Ice, elevation, narcotic analgesia and prophylactic antibiotics f  We will  reevaluate the patient in the office in 7 days.  Return to work in 7-10 days, strenuous activities in two weeks.   Rachael Elliott 06/09/2022 10:59 AM

## 2022-06-09 NOTE — Transfer of Care (Signed)
Immediate Anesthesia Transfer of Care Note  Patient: Rachael Elliott  Procedure(s) Performed: IMAGE GUIDED SINUS SURGERY MAXILLARY ANTROSTOMY WITHOUT TISSUE REMOVAL (Right) TOTAL ETHMOIDECTOMY (Right) SPHENOIDECTOMY (Bilateral) FRONTAL SINUS EXPLORATION (Right) INFERIOR TURBINATE REDUCTION (Bilateral)  Patient Location: PACU  Anesthesia Type: General ETT  Level of Consciousness: awake, alert  and patient cooperative  Airway and Oxygen Therapy: Patient Spontanous Breathing and Patient connected to supplemental oxygen  Post-op Assessment: Post-op Vital signs reviewed, Patient's Cardiovascular Status Stable, Respiratory Function Stable, Patent Airway and No signs of Nausea or vomiting  Post-op Vital Signs: Reviewed and stable  Complications: No notable events documented.

## 2022-06-10 ENCOUNTER — Encounter: Payer: Self-pay | Admitting: Otolaryngology

## 2022-06-10 LAB — SURGICAL PATHOLOGY

## 2023-02-22 ENCOUNTER — Ambulatory Visit: Payer: BC Managed Care – PPO | Admitting: Family Medicine

## 2023-02-22 ENCOUNTER — Encounter: Payer: Self-pay | Admitting: Family Medicine

## 2023-02-22 VITALS — BP 112/68 | HR 88 | Temp 98.3°F | Ht 68.0 in | Wt 165.0 lb

## 2023-02-22 DIAGNOSIS — Z0000 Encounter for general adult medical examination without abnormal findings: Secondary | ICD-10-CM

## 2023-02-22 DIAGNOSIS — Z1329 Encounter for screening for other suspected endocrine disorder: Secondary | ICD-10-CM

## 2023-02-22 DIAGNOSIS — Z0001 Encounter for general adult medical examination with abnormal findings: Secondary | ICD-10-CM

## 2023-02-22 DIAGNOSIS — K219 Gastro-esophageal reflux disease without esophagitis: Secondary | ICD-10-CM | POA: Diagnosis not present

## 2023-02-22 DIAGNOSIS — Z1159 Encounter for screening for other viral diseases: Secondary | ICD-10-CM

## 2023-02-22 MED ORDER — OMEPRAZOLE 20 MG PO CPDR: 20.0000 mg | DELAYED_RELEASE_CAPSULE | Freq: Every day | ORAL | 3 refills | Status: DC

## 2023-02-22 NOTE — Patient Instructions (Signed)
It was great to meet you today and I'm excited to have you join the [facility] practice. I hope you had a positive experience today! If you feel so inclined, please feel free to recommend our practice to friends and family. Mila Merry, FNP-C

## 2023-02-22 NOTE — Assessment & Plan Note (Signed)
Today your medical history was reviewed and routine physical exam with labs was performed. Recommend 150 minutes of moderate intensity exercise weekly and consuming a well-balanced diet. Advised to stop smoking if a smoker, avoid smoking if a non-smoker, limit alcohol consumption to 1 drink per day for women and 2 drinks per day for men, and avoid illicit drug use. Counseled on safe sex practices and offered STI testing today. Counseled on the importance of sunscreen use. Counseled in mental health awareness and when to seek medical care. Vaccine maintenance discussed, will check with OB regarding Tdap status. Appropriate health maintenance items reviewed. Return to office in 1 year for annual physical exam.

## 2023-02-22 NOTE — Progress Notes (Signed)
New Patient Office Visit  Subjective    Patient ID: Rachael Elliott, female    DOB: 11-19-1985  Age: 37 y.o. MRN: [id number]  CC:  Chief Complaint  Patient presents with   Establish Care    new patient ;establish care - [facility]\\historic medical records routed to [facility] 02/07/23 - [facility]\\\    HPI Rachael Elliott presents to establish care. Oriented to practice routines and expectations. She has been seeing a PCP regularly. PMH includes recurrent sinus infections, she does see ENT regularly and she has has surgery this year for this. She does see OBGYN for women's health and PAP smears. Current concerns include reflux and heartburn. She describes it as a need to clear her throat after eating, heartburn comes and goes. Unable to identify triggers, she believes sugar makes her have the phlegm in her throat more, symptoms are worse at night. She has tried tums.   Breast CA screening: Mammogram status: Completed June 2024. Repeat every year. Every 6 months. Maternal history. Cervical CA screening: approximate date 2023 and was normal Tobacco: non-smoker STI: declines Vaccines:  UTD, will check Tdap    Outpatient Encounter Medications as of 02/22/2023  Medication Sig   Azelastine HCl 137 MCG/SPRAY SOLN SMARTSIG:1-2 Spray(s) Both Nares Every 12 Hours PRN   clindamycin (CLEOCIN) 300 MG capsule 300 mg.   Multiple Vitamin (MULTIVITAMIN) tablet Take 1 tablet by mouth daily.   omeprazole (PRILOSEC) 20 MG capsule Take 1 capsule (20 mg total) by mouth daily.   acetaminophen (TYLENOL) 325 MG tablet Take 2 tablets (650 mg total) by mouth every 6 (six) hours as needed (for pain scale < 4). (Patient not taking: Reported on 02/22/2023)   montelukast (SINGULAIR) 10 MG tablet Take 10 mg by mouth at bedtime. Ran out/ not taking now (Patient not taking: Reported on 02/22/2023)   ondansetron (ZOFRAN) 4 MG tablet Take 1 tablet (4 mg total) by mouth every 8 (eight) hours as needed for nausea or vomiting. (Patient not  taking: Reported on 02/22/2023)   oxyCODONE-acetaminophen (PERCOCET) 5-325 MG tablet Take 1 tablet by mouth every 4 (four) hours as needed for severe pain. (Patient not taking: Reported on 02/22/2023)   sertraline (ZOLOFT) 50 MG tablet Take 50 mg by mouth daily. (Patient not taking: Reported on 02/22/2023)   No facility-administered encounter medications on file as of 02/22/2023.    Past Medical History:  Diagnosis Date   Headache    Every couple of days   Hx of varicella    Kidney stone    one time    Past Surgical History:  Procedure Laterality Date   ETHMOIDECTOMY Right 06/09/2022   Procedure: TOTAL ETHMOIDECTOMY;  Surgeon: Bud Face, MD;  Location: [facility];  Service: ENT;  Laterality: Right;   FRONTAL SINUS EXPLORATION Right 06/09/2022   Procedure: FRONTAL SINUS EXPLORATION;  Surgeon: Bud Face, MD;  Location: [facility];  Service: ENT;  Laterality: Right;   IMAGE GUIDED SINUS SURGERY N/A 06/09/2022   Procedure: IMAGE GUIDED SINUS SURGERY;  Surgeon: Bud Face, MD;  Location: [facility];  Service: ENT;  Laterality: N/A;  PLACED DISK ON OR CHARGE NURSE DESK 11-07 KP   MAXILLARY ANTROSTOMY Right 06/09/2022   Procedure: MAXILLARY ANTROSTOMY WITHOUT TISSUE REMOVAL;  Surgeon: Bud Face, MD;  Location: [facility];  Service: ENT;  Laterality: Right;   MYRINGOTOMY WITH TUBE PLACEMENT     as a child   SPHENOIDECTOMY Bilateral 06/09/2022   Procedure: SPHENOIDECTOMY;  Surgeon: Bud Face, MD;  Location: [facility];  Service: ENT;  Laterality: Bilateral;   TONSILLECTOMY     TURBINATE REDUCTION Bilateral 06/09/2022   Procedure: INFERIOR TURBINATE REDUCTION;  Surgeon: Bud Face, MD;  Location: [facility];  Service: ENT;  Laterality: Bilateral;    Family History  Problem Relation Age of Onset   Cancer Mother        breast   Cancer Maternal Grandfather     Social History   Socioeconomic  History   Marital status: Married    Spouse name: Not on file   Number of children: Not on file   Years of education: Not on file   Highest education level: Not on file  Occupational History   Not on file  Tobacco Use   Smoking status: Never   Smokeless tobacco: Never  Substance and Sexual Activity   Alcohol use: No   Drug use: No   Sexual activity: Yes    Birth control/protection: None  Other Topics Concern   Not on file  Social History Narrative   Not on file   Social Determinants of Health   Financial Resource Strain: Not on file  Food Insecurity: Not on file  Transportation Needs: Not on file  Physical Activity: Not on file  Stress: Not on file  Social Connections: Unknown (12/04/2021)   Received from [facility]   Social Network    Social Network: Not on file  Intimate Partner Violence: Unknown (10/30/2021)   Received from [facility], [facility]   HITS    Physically Hurt: Not on file    Insult or Talk Down To: Not on file    Threaten Physical Harm: Not on file    Scream or Curse: Not on file    Review of Systems  Constitutional: Negative.   HENT: Negative.    Eyes: Negative.   Respiratory: Negative.    Cardiovascular: Negative.   Gastrointestinal:  Positive for heartburn.  Genitourinary: Negative.   Musculoskeletal: Negative.   Skin: Negative.   Neurological: Negative.   Endo/Heme/Allergies: Negative.   Psychiatric/Behavioral: Negative.    All other systems reviewed and are negative.       Objective    BP 112/68   Pulse 88   Temp 98.3 F (36.8 C) (Oral)   Ht 5\' 8"  (1.727 m)   Wt 165 lb (74.8 kg)   LMP  (Approximate)   SpO2 98%   BMI 25.09 kg/m   Physical Exam Vitals and nursing note reviewed.  Constitutional:      Appearance: Normal appearance. She is normal weight.  HENT:     Head: Normocephalic and atraumatic.     Right Ear: Tympanic membrane, ear canal and external ear normal.     Left Ear: Tympanic membrane,  ear canal and external ear normal.     Nose: Nose normal.     Mouth/Throat:     Mouth: Mucous membranes are moist.     Pharynx: Oropharynx is clear.  Eyes:     Extraocular Movements: Extraocular movements intact.     Conjunctiva/sclera: Conjunctivae normal.     Pupils: Pupils are equal, round, and reactive to light.  Cardiovascular:     Rate and Rhythm: Normal rate and regular rhythm.     Pulses: Normal pulses.     Heart sounds: Normal heart sounds.  Pulmonary:     Effort: Pulmonary effort is normal.     Breath sounds: Normal breath sounds.  Abdominal:     General: Bowel sounds  are normal.     Palpations: Abdomen is soft.  Musculoskeletal:        General: Normal range of motion.     Cervical back: Normal range of motion and neck supple.  Skin:    General: Skin is warm and dry.     Capillary Refill: Capillary refill takes less than 2 seconds.  Neurological:     General: No focal deficit present.     Mental Status: She is alert and oriented to person, place, and time. Mental status is at baseline.  Psychiatric:        Mood and Affect: Mood normal.        Behavior: Behavior normal.        Thought Content: Thought content normal.        Judgment: Judgment normal.         Assessment & Plan:   Problem List Items Addressed This Visit     Physical exam, annual - Primary    Today your medical history was reviewed and routine physical exam with labs was performed. Recommend 150 minutes of moderate intensity exercise weekly and consuming a well-balanced diet. Advised to stop smoking if a smoker, avoid smoking if a non-smoker, limit alcohol consumption to 1 drink per day for women and 2 drinks per day for men, and avoid illicit drug use. Counseled on safe sex practices and offered STI testing today. Counseled on the importance of sunscreen use. Counseled in mental health awareness and when to seek medical care. Vaccine maintenance discussed, will check with OB regarding Tdap status.  Appropriate health maintenance items reviewed. Return to office in 1 year for annual physical exam.       Relevant Orders   CBC with Differential/Platelet   COMPLETE METABOLIC PANEL WITH GFR   Lipid panel   TSH   Hepatitis C antibody   Gastroesophageal reflux disease without esophagitis    The pathophysiology of reflux is discussed.  Anti-reflux measures such as raising the head of the bed, avoiding tight clothing or belts, avoiding eating late at night and not lying down shortly after mealtime and achieving weight loss are discussed. Avoid ASA, NSAID's, caffeine, peppermints, alcohol and tobacco. OTC H2 blockers and/or antacids are often very helpful for PRN use. However, for persisting chronic or daily symptoms, prescription strength H2 blockers or a trial of PPI's are often used. Further recommendations to her. She should alert me if there are persistent symptoms, dysphagia, weight loss or GI bleeding. Follow up if symptoms worse or do not improve in 1 week.       Relevant Medications   omeprazole (PRILOSEC) 20 MG capsule   Other Visit Diagnoses     Need for hepatitis C screening test       Relevant Orders   Hepatitis C antibody   Screening for thyroid disorder       Relevant Orders   TSH       Return for annual physical with labs 1 week prior.   Park Meo, FNP

## 2023-02-22 NOTE — Assessment & Plan Note (Signed)
The pathophysiology of reflux is discussed.  Anti-reflux measures such as raising the head of the bed, avoiding tight clothing or belts, avoiding eating late at night and not lying down shortly after mealtime and achieving weight loss are discussed. Avoid ASA, NSAID's, caffeine, peppermints, alcohol and tobacco. OTC H2 blockers and/or antacids are often very helpful for PRN use. However, for persisting chronic or daily symptoms, prescription strength H2 blockers or a trial of PPI's are often used. Further recommendations to her. She should alert me if there are persistent symptoms, dysphagia, weight loss or GI bleeding. Follow up if symptoms worse or do not improve in 1 week.

## 2023-03-03 ENCOUNTER — Other Ambulatory Visit: Payer: BC Managed Care – PPO

## 2023-06-18 ENCOUNTER — Other Ambulatory Visit: Payer: Self-pay | Admitting: Family Medicine

## 2023-06-21 ENCOUNTER — Other Ambulatory Visit: Payer: Self-pay | Admitting: Family Medicine

## 2023-06-21 ENCOUNTER — Telehealth: Payer: Self-pay

## 2023-06-21 MED ORDER — OMEPRAZOLE 20 MG PO CPDR: 20.0000 mg | DELAYED_RELEASE_CAPSULE | Freq: Every day | ORAL | 3 refills | Status: DC

## 2023-06-21 NOTE — Telephone Encounter (Signed)
Copied from CRM [id number]. Topic: Clinical - Medication Refill >> Jun 21, 2023  1:52 PM Theodis Sato wrote: Most Recent Primary Care Visit:  Provider: [facility]  Department: [facility]  Visit Type: LAB  Date: 03/03/2023  Medication: Omeprazole (90 day supply)  Has the patient contacted their pharmacy? Yes pharmacy states they don't have the prescription. (Agent: If no, request that the patient contact the pharmacy for the refill. If patient does not wish to contact the pharmacy document the reason why and proceed with request.) (Agent: If yes, when and what did the pharmacy advise?)  Is this the correct pharmacy for this prescription? Yes If no, delete pharmacy and type the correct one.  This is the patient's preferred pharmacy:  [id number] [place], [place] - [address] [place] [address] Phone: [phone] Fax: [phone]   Has the prescription been filled recently? Unknown- Medication not listed in PT chart  Is the patient out of the medication? No  Has the patient been seen for an appointment in the last year OR does the patient have an upcoming appointment? Yes   Can we respond through MyChart? No  Agent: Please be advised that Rx refills may take up to 3 business days. We ask that you follow-up with your pharmacy.

## 2023-09-17 ENCOUNTER — Other Ambulatory Visit: Payer: Self-pay | Admitting: Family Medicine

## 2023-12-13 ENCOUNTER — Other Ambulatory Visit: Payer: Self-pay | Admitting: Family Medicine

## 2024-02-23 ENCOUNTER — Encounter: Payer: Self-pay | Admitting: Family Medicine
